# Patient Record
Sex: Female | Born: 1960 | Race: White | Hispanic: No | Marital: Married | Smoking: Never smoker
Health system: Southern US, Community
[De-identification: ages and names within clinical notes are randomized; demographics above are authoritative.]

## PROBLEM LIST (undated history)

## (undated) HISTORY — PX: BREAST BIOPSY: SHX20

---
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
## 1992-06-12 HISTORY — PX: HERNIA REPAIR: SHX51

## 1999-04-19 ENCOUNTER — Other Ambulatory Visit: Admission: RE | Admit: 1999-04-19 | Discharge: 1999-04-19 | Payer: Self-pay | Admitting: Obstetrics and Gynecology

## 1999-07-01 ENCOUNTER — Encounter: Payer: Self-pay | Admitting: Family Medicine

## 1999-07-01 ENCOUNTER — Ambulatory Visit (HOSPITAL_COMMUNITY): Admission: RE | Admit: 1999-07-01 | Discharge: 1999-07-01 | Payer: Self-pay | Admitting: Family Medicine

## 1999-08-17 ENCOUNTER — Ambulatory Visit (HOSPITAL_COMMUNITY)
Admission: RE | Admit: 1999-08-17 | Discharge: 1999-08-17 | Payer: Self-pay | Admitting: Orthodontics and Dentofacial Orthopedics

## 1999-08-17 ENCOUNTER — Encounter: Payer: Self-pay | Admitting: Orthodontics and Dentofacial Orthopedics

## 2000-08-06 ENCOUNTER — Encounter: Admission: RE | Admit: 2000-08-06 | Discharge: 2000-08-06 | Payer: Self-pay | Admitting: Sports Medicine

## 2003-08-31 ENCOUNTER — Other Ambulatory Visit: Admission: RE | Admit: 2003-08-31 | Discharge: 2003-08-31 | Payer: Self-pay | Admitting: Obstetrics and Gynecology

## 2004-12-22 ENCOUNTER — Other Ambulatory Visit: Admission: RE | Admit: 2004-12-22 | Discharge: 2004-12-22 | Payer: Self-pay | Admitting: Obstetrics and Gynecology

## 2005-01-26 ENCOUNTER — Ambulatory Visit: Payer: Self-pay | Admitting: Hematology and Oncology

## 2005-03-15 ENCOUNTER — Ambulatory Visit: Payer: Self-pay | Admitting: Hematology and Oncology

## 2006-09-20 ENCOUNTER — Encounter: Payer: Self-pay | Admitting: Sports Medicine

## 2006-09-21 ENCOUNTER — Ambulatory Visit: Payer: Self-pay | Admitting: Sports Medicine

## 2006-09-21 ENCOUNTER — Telehealth: Payer: Self-pay | Admitting: Sports Medicine

## 2006-09-21 DIAGNOSIS — M25519 Pain in unspecified shoulder: Secondary | ICD-10-CM | POA: Insufficient documentation

## 2006-09-21 DIAGNOSIS — M542 Cervicalgia: Secondary | ICD-10-CM | POA: Insufficient documentation

## 2006-10-17 DIAGNOSIS — S90129A Contusion of unspecified lesser toe(s) without damage to nail, initial encounter: Secondary | ICD-10-CM | POA: Insufficient documentation

## 2006-10-19 ENCOUNTER — Ambulatory Visit: Payer: Self-pay | Admitting: Sports Medicine

## 2006-10-19 DIAGNOSIS — Q828 Other specified congenital malformations of skin: Secondary | ICD-10-CM | POA: Insufficient documentation

## 2006-10-19 DIAGNOSIS — M6281 Muscle weakness (generalized): Secondary | ICD-10-CM | POA: Insufficient documentation

## 2008-06-22 ENCOUNTER — Ambulatory Visit: Payer: Self-pay | Admitting: Sports Medicine

## 2008-06-22 DIAGNOSIS — M7710 Lateral epicondylitis, unspecified elbow: Secondary | ICD-10-CM | POA: Insufficient documentation

## 2008-08-23 ENCOUNTER — Encounter: Payer: Self-pay | Admitting: Family Medicine

## 2008-10-05 ENCOUNTER — Encounter: Payer: Self-pay | Admitting: Family Medicine

## 2008-12-08 ENCOUNTER — Ambulatory Visit: Payer: Self-pay | Admitting: Family Medicine

## 2008-12-08 LAB — CONVERTED CEMR LAB
ALT: 12 units/L (ref 0–35)
AST: 17 units/L (ref 0–37)
Albumin: 3.9 g/dL (ref 3.5–5.2)
Alkaline Phosphatase: 34 units/L — ABNORMAL LOW (ref 39–117)
BUN: 10 mg/dL (ref 6–23)
Basophils Absolute: 0 10*3/uL (ref 0.0–0.1)
Basophils Relative: 0.1 % (ref 0.0–3.0)
Bilirubin, Direct: 0 mg/dL (ref 0.0–0.3)
CO2: 27 meq/L (ref 19–32)
Calcium: 9.3 mg/dL (ref 8.4–10.5)
Chloride: 111 meq/L (ref 96–112)
Cholesterol: 200 mg/dL (ref 0–200)
Creatinine, Ser: 0.8 mg/dL (ref 0.4–1.2)
Eosinophils Absolute: 0.4 10*3/uL (ref 0.0–0.7)
Eosinophils Relative: 7.3 % — ABNORMAL HIGH (ref 0.0–5.0)
GFR calc non Af Amer: 80.94 mL/min (ref >60)
Glucose, Bld: 86 mg/dL (ref 70–99)
HCT: 36.1 % (ref 36.0–46.0)
HDL: 60.3 mg/dL (ref >39.00)
Hemoglobin: 12.3 g/dL (ref 12.0–15.0)
LDL Cholesterol: 130 mg/dL — ABNORMAL HIGH (ref 0–99)
Lymphocytes Relative: 23.9 % (ref 12.0–46.0)
Lymphs Abs: 1.2 10*3/uL (ref 0.7–4.0)
MCHC: 34.2 g/dL (ref 30.0–36.0)
MCV: 90.5 fL (ref 78.0–100.0)
Monocytes Absolute: 0.5 10*3/uL (ref 0.1–1.0)
Monocytes Relative: 10.1 % (ref 3.0–12.0)
Neutro Abs: 2.8 10*3/uL (ref 1.4–7.7)
Neutrophils Relative %: 58.6 % (ref 43.0–77.0)
Platelets: 239 10*3/uL (ref 150.0–400.0)
Potassium: 4.2 meq/L (ref 3.5–5.1)
RBC: 3.99 M/uL (ref 3.87–5.11)
RDW: 12.4 % (ref 11.5–14.6)
Sodium: 143 meq/L (ref 135–145)
TSH: 1.62 microintl units/mL (ref 0.35–5.50)
Total Bilirubin: 0.9 mg/dL (ref 0.3–1.2)
Total CHOL/HDL Ratio: 3
Total Protein: 6.8 g/dL (ref 6.0–8.3)
Triglycerides: 51 mg/dL (ref 0.0–149.0)
VLDL: 10.2 mg/dL (ref 0.0–40.0)
WBC: 4.9 10*3/uL (ref 4.5–10.5)

## 2008-12-09 ENCOUNTER — Ambulatory Visit: Payer: Self-pay | Admitting: Family Medicine

## 2008-12-09 DIAGNOSIS — H6980 Other specified disorders of Eustachian tube, unspecified ear: Secondary | ICD-10-CM | POA: Insufficient documentation

## 2008-12-09 DIAGNOSIS — H6990 Unspecified Eustachian tube disorder, unspecified ear: Secondary | ICD-10-CM | POA: Insufficient documentation

## 2008-12-09 LAB — CONVERTED CEMR LAB
Bilirubin Urine: NEGATIVE
Blood in Urine, dipstick: NEGATIVE
Glucose, Urine, Semiquant: NEGATIVE
Ketones, urine, test strip: NEGATIVE
Nitrite: NEGATIVE
Protein, U semiquant: NEGATIVE
Specific Gravity, Urine: 1.02
Urobilinogen, UA: 0.2
WBC Urine, dipstick: NEGATIVE
pH: 5.5

## 2008-12-11 ENCOUNTER — Telehealth: Payer: Self-pay | Admitting: Family Medicine

## 2008-12-15 ENCOUNTER — Ambulatory Visit: Payer: Self-pay | Admitting: Family Medicine

## 2009-05-10 ENCOUNTER — Ambulatory Visit: Payer: Self-pay | Admitting: Family Medicine

## 2009-05-10 DIAGNOSIS — J019 Acute sinusitis, unspecified: Secondary | ICD-10-CM | POA: Insufficient documentation

## 2010-09-07 ENCOUNTER — Other Ambulatory Visit (INDEPENDENT_AMBULATORY_CARE_PROVIDER_SITE_OTHER): Payer: BC Managed Care – PPO | Admitting: Family Medicine

## 2010-09-07 DIAGNOSIS — Z0000 Encounter for general adult medical examination without abnormal findings: Secondary | ICD-10-CM

## 2010-09-07 LAB — TSH: TSH: 1.35 u[IU]/mL (ref 0.35–5.50)

## 2010-09-07 LAB — BASIC METABOLIC PANEL
BUN: 15 mg/dL (ref 6–23)
CO2: 28 mEq/L (ref 19–32)
Calcium: 9.6 mg/dL (ref 8.4–10.5)
Chloride: 108 mEq/L (ref 96–112)
Creatinine, Ser: 0.9 mg/dL (ref 0.4–1.2)
GFR: 74.94 mL/min (ref >60.00)
Glucose, Bld: 80 mg/dL (ref 70–99)
Potassium: 4.5 mEq/L (ref 3.5–5.1)
Sodium: 139 mEq/L (ref 135–145)

## 2010-09-07 LAB — HEPATIC FUNCTION PANEL
ALT: 14 U/L (ref 0–35)
AST: 15 U/L (ref 0–37)
Albumin: 4 g/dL (ref 3.5–5.2)
Alkaline Phosphatase: 39 U/L (ref 39–117)
Bilirubin, Direct: 0.1 mg/dL (ref 0.0–0.3)
Total Bilirubin: 0.5 mg/dL (ref 0.3–1.2)
Total Protein: 6.6 g/dL (ref 6.0–8.3)

## 2010-09-07 LAB — POCT URINALYSIS DIPSTICK
Bilirubin, UA: NEGATIVE
Leukocytes, UA: NEGATIVE
Nitrite, UA: NEGATIVE
Protein, UA: NEGATIVE
Urobilinogen, UA: 0.2
pH, UA: 6.5

## 2010-09-07 LAB — CBC WITH DIFFERENTIAL/PLATELET
Basophils Absolute: 0 10*3/uL (ref 0.0–0.1)
Basophils Relative: 0.4 % (ref 0.0–3.0)
Eosinophils Absolute: 0.1 10*3/uL (ref 0.0–0.7)
Eosinophils Relative: 1.9 % (ref 0.0–5.0)
HCT: 37.6 % (ref 36.0–46.0)
Hemoglobin: 12.6 g/dL (ref 12.0–15.0)
Lymphocytes Relative: 15.7 % (ref 12.0–46.0)
Lymphs Abs: 1.2 10*3/uL (ref 0.7–4.0)
MCHC: 33.7 g/dL (ref 30.0–36.0)
MCV: 93 fl (ref 78.0–100.0)
Monocytes Absolute: 0.5 10*3/uL (ref 0.1–1.0)
Monocytes Relative: 6.9 % (ref 3.0–12.0)
Neutro Abs: 5.7 10*3/uL (ref 1.4–7.7)
Neutrophils Relative %: 75.1 % (ref 43.0–77.0)
Platelets: 237 10*3/uL (ref 150.0–400.0)
RBC: 4.04 Mil/uL (ref 3.87–5.11)
RDW: 14.1 % (ref 11.5–14.6)
WBC: 7.6 10*3/uL (ref 4.5–10.5)

## 2010-09-07 LAB — LIPID PANEL
Cholesterol: 224 mg/dL — ABNORMAL HIGH (ref 0–200)
HDL: 79.1 mg/dL (ref >39.00)
Total CHOL/HDL Ratio: 3
Triglycerides: 35 mg/dL (ref 0.0–149.0)
VLDL: 7 mg/dL (ref 0.0–40.0)

## 2010-09-14 ENCOUNTER — Telehealth: Payer: Self-pay | Admitting: *Deleted

## 2010-09-14 NOTE — Telephone Encounter (Signed)
Noted.  Will assess at CPE tomorrow.

## 2010-09-14 NOTE — Telephone Encounter (Signed)
Pt fell on tennis courts several days ago and has a huge bruise.  Is a little concerned about a blood clot due to her clotting disorder, but has an appt tomorrow for CPX.  Advised to let Dr. Caryl Never see it then, and  There should be no problem with a traumatic injury causing a blood clot.  She is taking Advil.

## 2010-09-15 ENCOUNTER — Encounter: Payer: Self-pay | Admitting: Family Medicine

## 2010-09-15 ENCOUNTER — Ambulatory Visit (INDEPENDENT_AMBULATORY_CARE_PROVIDER_SITE_OTHER): Payer: BC Managed Care – PPO | Admitting: Family Medicine

## 2010-09-15 VITALS — BP 130/80 | Temp 97.0°F | Ht 66.75 in | Wt 130.0 lb

## 2010-09-15 DIAGNOSIS — Z0000 Encounter for general adult medical examination without abnormal findings: Secondary | ICD-10-CM

## 2010-09-15 NOTE — Patient Instructions (Signed)
Continue with yearly physical exam. Please contact me if you are willing to consider scheduling colonoscopy.

## 2010-09-15 NOTE — Progress Notes (Signed)
  Subjective:    Patient ID: Natalie Carpenter, female    DOB: 03-20-1961, 50 y.o.   MRN: [id number]  HPI Patient here for complete physical. She sees gynecologist for Pap smears and mammograms. She's never had colon cancer screening has not decided she wishes to pursue this time. Overall very healthy. She is vegetarian. Good knowledge of nutrition and diet.  Tetanus 2008. Exercises regularly. Calcium supplementation.  Recent fall playing tennis with large bruise right lateral hip otherwise doing well   Review of Systems  Constitutional: Negative for fever, activity change, appetite change, fatigue and unexpected weight change.  HENT: Negative for hearing loss, sore throat and trouble swallowing.   Respiratory: Negative for cough, shortness of breath and wheezing.   Cardiovascular: Negative for chest pain, palpitations and leg swelling.  Gastrointestinal: Negative for nausea, vomiting, abdominal pain, blood in stool and abdominal distention.  Genitourinary: Negative for dysuria and hematuria.  Musculoskeletal: Negative for myalgias and gait problem.  Skin: Negative for rash.  Neurological: Negative for dizziness, syncope, weakness and headaches.  Hematological: Negative for adenopathy.  Psychiatric/Behavioral: Negative for confusion and dysphoric mood.       Objective:   Physical Exam  Constitutional: She is oriented to person, place, and time. She appears well-developed and well-nourished.  HENT:  Head: Normocephalic and atraumatic.  Eyes: EOM are normal. Pupils are equal, round, and reactive to light.  Neck: Normal range of motion. Neck supple. No thyromegaly present.  Cardiovascular: Normal rate, regular rhythm and normal heart sounds.   No murmur heard. Pulmonary/Chest: Breath sounds normal. No respiratory distress. She has no wheezes. She has no rales.  Abdominal: Soft. Bowel sounds are normal. She exhibits no distension and no mass. There is no tenderness. There is no rebound and  no guarding.  Genitourinary:       Per GYN  Musculoskeletal: Normal range of motion. She exhibits no edema.       Large ecchymosis right lateral hip but excellent range of motion with internal and external rotation. Local tenderness to palpation right lateral hip  Lymphadenopathy:    She has no cervical adenopathy.  Neurological: She is alert and oriented to person, place, and time. She displays normal reflexes. No cranial nerve deficit.  Skin: No rash noted.  Psychiatric: She has a normal mood and affect. Her behavior is normal. Judgment and thought content normal.          Assessment & Plan:  Healthy 89 neural female. Tetanus up-to-date. Continue yearly mammograms. Discussed colonoscopy and have recommended and she will consider and in touch if she decides on that date for that. Labs reviewed and all excellent

## 2010-12-20 ENCOUNTER — Encounter: Payer: Self-pay | Admitting: Family Medicine

## 2011-07-20 ENCOUNTER — Telehealth: Payer: Self-pay | Admitting: Family Medicine

## 2011-07-20 NOTE — Telephone Encounter (Signed)
Pt has daughter who would be a new pt to MD, requesting 330pm ov. Can I create 30 min?

## 2011-07-21 NOTE — Telephone Encounter (Signed)
YES

## 2011-07-21 NOTE — Telephone Encounter (Signed)
lmom for pt mom to callback °

## 2011-07-24 NOTE — Telephone Encounter (Signed)
Pt daughter is sch for 08-07-2011 330pm

## 2012-01-10 ENCOUNTER — Other Ambulatory Visit: Payer: Self-pay | Admitting: Radiology

## 2012-01-12 ENCOUNTER — Encounter: Payer: Self-pay | Admitting: Family Medicine

## 2012-07-02 ENCOUNTER — Telehealth: Payer: Self-pay | Admitting: Family Medicine

## 2012-07-02 NOTE — Telephone Encounter (Signed)
Patient Information:  Caller Name: Rosena  Phone: [phone]  Patient: Kaliah, [place]da[place]  Gender: Female  DOB: August 25, 1960  Age: 52 Years  PCP: Evelena Peat (Family Practice)  Pregnant: No  Office Follow Up:  Does the office need to follow up with this patient?: No  Instructions For The Office: N/A   Symptoms  Reason For Call & Symptoms: symptoms started 3-4wks ago; took an old Zpak 1/3-1/7 which helped a little, but sxs came right back; feels achy; yellowish, thick mucus; feels congested and having pressure; unable to get rid of her HA  Reviewed Health History In EMR: Yes  Reviewed Medications In EMR: Yes  Reviewed Allergies In EMR: Yes  Reviewed Surgeries / Procedures: Yes  Date of Onset of Symptoms: Unknown  Treatments Tried: Sudafed, Tylenol, Motrin  Treatments Tried Worked: No OB / GYN:  LMP: Unknown  Guideline(s) Used:  Colds  Disposition Per Guideline:   See Today or Tomorrow in Office  Reason For Disposition Reached:   Sinus congestion (pressure, fullness) present > 10 days  Advice Given:  Call Back If:  You become worse  Appointment Scheduled:  07/03/2012 09:30:00 Appointment Scheduled Provider:  Evelena Peat (Family Practice)

## 2012-07-02 NOTE — Telephone Encounter (Deleted)
Patient Information:  Caller Name: Tinnie Gens  Phone: [phone]  Patient: Natalie Carpenter  Gender: Female  DOB: 01/15/1962  Age: 52 Years  PCP: Gershon Crane [facility])  Office Follow Up:  Does the office need to follow up with this patient?: Yes  Instructions For The Office: wants to know what OTC decongestants can be used with his HTN  RN Note:  declined appt at this time, saying he already had one 07/13/12  Symptoms  Reason For Call & Symptoms: symptoms started over a wk ago with a stuffy head, HA and greenish mucus; has a dry cough that is worse at night  Reviewed Health History In EMR: Yes  Reviewed Medications In EMR: Yes  Reviewed Allergies In EMR: Yes  Reviewed Surgeries / Procedures: Yes  Date of Onset of Symptoms: Unknown  Treatments Tried: OTC sinus pain med  Treatments Tried Worked: No  Guideline(s) Used:  Colds  Disposition Per Guideline:   See Today or Tomorrow in Office  Reason For Disposition Reached:   Sinus congestion (pressure, fullness) present > 10 days  Advice Given:  Humidifier:  If the air in your home is dry, use a cool-mist humidifier  Caution - Nasal Decongestants:  Do not take these medications if you have high blood pressure, heart disease, prostate problems, or an overactive thyroid.  Neti Pot  How it Helps: The EchoStar performs nasal washing (also called nasal irrigation or "jala neti"). The salt water rinses out excess mucus, washes out any irritants (dust, allergens) that might be present, and moisturizes the nasal cavity.  Indications: The EchoStar is widely used as a home remedy to relieve conditions such as colds, sinus infections, and hay fever (nasal allergies).  Call Back If:  You become worse  For a Stuffy Nose - Use Nasal Washes:  Introduction: Saline (salt water) nasal irrigation (nasal wash) is an effective and simple home remedy for treating stuffy nose and sinus congestion. The nose can be irrigated by pouring, spraying, or  squirting salt water into the nose and then letting it run back out.  How it Helps: The salt water rinses out excess mucus, washes out any irritants (dust, allergens) that might be present, and moistens the nasal cavity.  Methods: There are several ways to perform nasal irrigation. You can use a saline nasal spray bottle (available over-the-counter), a rubber ear syringe, a medical syringe without the needle, or a Neti Pot.  RN Overrode Recommendation:  Follow Up With Office Later  declined appt at this time

## 2012-07-03 ENCOUNTER — Encounter: Payer: Self-pay | Admitting: Family Medicine

## 2012-07-03 ENCOUNTER — Ambulatory Visit (INDEPENDENT_AMBULATORY_CARE_PROVIDER_SITE_OTHER): Payer: BC Managed Care – PPO | Admitting: Family Medicine

## 2012-07-03 VITALS — BP 118/78 | Temp 97.4°F | Wt 124.0 lb

## 2012-07-03 DIAGNOSIS — J329 Chronic sinusitis, unspecified: Secondary | ICD-10-CM

## 2012-07-03 MED ORDER — AZITHROMYCIN 250 MG PO TABS: ORAL_TABLET | ORAL | Status: DC

## 2012-07-03 NOTE — Progress Notes (Signed)
  Subjective:    Patient ID: Natalie Carpenter, female    DOB: [birth year], 52 y.o.   MRN: [id number]  HPI  For over 2 weeks, colored nasal discharge.  Aches, facial pain-maxillary PND.  Colored nasal d/c.  No fever.  Increased malaise.  No cough or sore throat.  Headaches-bifrontal .  Increased sinus pressure Frequent Tylenol and Motrin with mild relief.   Sudafed but had side effects.  Left upper teeth pain off and on. Recent dental evaluation negative   Review of Systems  Constitutional: Positive for fatigue. Negative for fever and chills.  HENT: Positive for congestion, postnasal drip and sinus pressure. Negative for sore throat.   Respiratory: Negative for cough.   Neurological: Positive for headaches.       Objective:   Physical Exam  HENT:  Right Ear: External ear normal.  Left Ear: External ear normal.  Mouth/Throat: Oropharynx is clear and moist.  Neck: Neck supple. No thyromegaly present.  Cardiovascular: Normal rate and regular rhythm.   No murmur heard. Pulmonary/Chest: Effort normal and breath sounds normal. No respiratory distress. She has no wheezes. She has no rales.  Lymphadenopathy:    She has no cervical adenopathy.          Assessment & Plan:  Acute sinusitis. Zithromax for 5 days. If no better in 2 weeks consider broad coverage for anaerobes

## 2012-07-03 NOTE — Patient Instructions (Signed)

## 2013-01-30 ENCOUNTER — Other Ambulatory Visit (INDEPENDENT_AMBULATORY_CARE_PROVIDER_SITE_OTHER): Payer: BC Managed Care – PPO

## 2013-01-30 DIAGNOSIS — Z0000 Encounter for general adult medical examination without abnormal findings: Secondary | ICD-10-CM

## 2013-01-30 LAB — POCT URINALYSIS DIPSTICK
Bilirubin, UA: NEGATIVE
Nitrite, UA: NEGATIVE
Protein, UA: NEGATIVE
pH, UA: 7

## 2013-01-30 LAB — BASIC METABOLIC PANEL
CO2: 29 mEq/L (ref 19–32)
Calcium: 10.3 mg/dL (ref 8.4–10.5)
Chloride: 102 mEq/L (ref 96–112)
Glucose, Bld: 84 mg/dL (ref 70–99)
Potassium: 4.4 mEq/L (ref 3.5–5.1)
Sodium: 136 mEq/L (ref 135–145)

## 2013-01-30 LAB — CBC WITH DIFFERENTIAL/PLATELET
HCT: 40 % (ref 36.0–46.0)
Hemoglobin: 13.3 g/dL (ref 12.0–15.0)
MCHC: 33.2 g/dL (ref 30.0–36.0)
MCV: 90.9 fl (ref 78.0–100.0)
Monocytes Absolute: 0.5 10*3/uL (ref 0.1–1.0)
Neutro Abs: 3 10*3/uL (ref 1.4–7.7)
RDW: 13.4 % (ref 11.5–14.6)

## 2013-01-30 LAB — HEPATIC FUNCTION PANEL
ALT: 13 U/L (ref 0–35)
Albumin: 4.4 g/dL (ref 3.5–5.2)
Bilirubin, Direct: 0.1 mg/dL (ref 0.0–0.3)
Total Protein: 7.5 g/dL (ref 6.0–8.3)

## 2013-01-30 LAB — LIPID PANEL
Cholesterol: 246 mg/dL — ABNORMAL HIGH (ref 0–200)
HDL: 70.5 mg/dL (ref >39.00)
Total CHOL/HDL Ratio: 3
Triglycerides: 53 mg/dL (ref 0.0–149.0)
VLDL: 10.6 mg/dL (ref 0.0–40.0)

## 2013-02-06 ENCOUNTER — Ambulatory Visit (INDEPENDENT_AMBULATORY_CARE_PROVIDER_SITE_OTHER): Payer: BC Managed Care – PPO | Admitting: Family Medicine

## 2013-02-06 ENCOUNTER — Encounter: Payer: Self-pay | Admitting: Family Medicine

## 2013-02-06 VITALS — BP 120/70 | HR 62 | Temp 97.7°F | Wt 128.0 lb

## 2013-02-06 DIAGNOSIS — Z0000 Encounter for general adult medical examination without abnormal findings: Secondary | ICD-10-CM

## 2013-02-06 NOTE — Progress Notes (Signed)
  Subjective:    Patient ID: Natalie Carpenter, female    DOB: 08-06-1960, 52 y.o.   MRN: [id number]  HPI Complete physical Patient had recent bilateral vitreous detachment and is followed by ophthalmology. Visual symptoms are stable. She is generally very healthy. She has history of mildly elevated lipids but has good HDL. Nonsmoker. Family history of premature CAD. Currently takes no regular medications. Tetanus up to date. She continues sees gynecologist regularly  She has refused colonoscopies in the past. She does agree to Hemoccult cards.  Recently done some gardening. Possible foreign body left ring finger PIP area. Nonpainful. No warmth or significant erythema.  No past medical history on file. Past Surgical History  Procedure Laterality Date  . Hernia repair  1994    umbilical hernia    reports that she has never smoked. She does not have any smokeless tobacco history on file. Her alcohol and drug histories are not on file. family history includes Cancer in her father; Sarcoidosis in her sister. Allergies  Allergen Reactions  . Sulfonamide Derivatives     REACTION: rash      Review of Systems  Constitutional: Negative for fever, activity change, appetite change, fatigue and unexpected weight change.  HENT: Negative for hearing loss, ear pain, sore throat and trouble swallowing.   Eyes: Negative for photophobia, pain and redness.  Respiratory: Negative for cough and shortness of breath.   Cardiovascular: Negative for chest pain and palpitations.  Gastrointestinal: Negative for abdominal pain, diarrhea, constipation and blood in stool.  Genitourinary: Negative for dysuria and hematuria.  Musculoskeletal: Negative for myalgias, back pain and arthralgias.  Skin: Negative for rash.  Neurological: Negative for dizziness, syncope and headaches.  Hematological: Negative for adenopathy.  Psychiatric/Behavioral: Negative for confusion and dysphoric mood.       Objective:   Physical Exam  Constitutional: She is oriented to person, place, and time. She appears well-developed and well-nourished.  HENT:  Head: Normocephalic and atraumatic.  Eyes: EOM are normal. Pupils are equal, round, and reactive to light.  Neck: Normal range of motion. Neck supple. No thyromegaly present.  Cardiovascular: Normal rate, regular rhythm and normal heart sounds.   No murmur heard. Pulmonary/Chest: Breath sounds normal. No respiratory distress. She has no wheezes. She has no rales.  Abdominal: Soft. Bowel sounds are normal. She exhibits no distension and no mass. There is no tenderness. There is no rebound and no guarding.  Genitourinary:  Per GYN  Musculoskeletal: Normal range of motion. She exhibits no edema.  Left ring finger reveals minimal soft tissue edema over the PIP joint but no significant erythema and no warmth. No tenderness. Full range of motion.  Lymphadenopathy:    She has no cervical adenopathy.  Neurological: She is alert and oriented to person, place, and time. She displays normal reflexes. No cranial nerve deficit.  Skin: No rash noted.  Psychiatric: She has a normal mood and affect. Her behavior is normal. Judgment and thought content normal.          Assessment & Plan:  Complete physical. Labs reviewed. She has elevated cholesterol 246 but excellent HDL. Overall very low risk for CAD with computed 10 year risk of 1%. No indication for statin therapy. Cholesterol control diet discussed. Discussed colonoscopy screening and at this point she is not willing to set up. She does agree to Hemoccult cards.

## 2013-02-06 NOTE — Patient Instructions (Addendum)

## 2013-07-09 ENCOUNTER — Telehealth: Payer: Self-pay | Admitting: Family Medicine

## 2013-07-09 ENCOUNTER — Ambulatory Visit: Payer: BC Managed Care – PPO | Admitting: Family Medicine

## 2013-07-09 NOTE — Telephone Encounter (Signed)
Patient Information:  Caller Name: Simya  Phone: [phone]  Patient: Natalie Carpenter, Natalie Carpenter  Gender: Female  DOB: [birth year]  Age: 53 Years  PCP: Carolann Littler [facility])  Pregnant: No  Office Follow Up:  Does the office need to follow up with this patient?: No  Instructions For The Office: N/A  RN Note:  LMP 2/14. Sore throat is worst symptom; pain rated 7/10. Drinking & eating soft foods. Requested appointment for 07/09/13 at 1430 be cancelled.  Will follow home care recommendations and call if symptoms persist or worsen.  Symptoms  Reason For Call & Symptoms: Gradual onset of chest and head congestion, sore throat, headache and body aching.  No fever.  Reviewed Health History In EMR: Yes  Reviewed Medications In EMR: Yes  Reviewed Allergies In EMR: Yes  Reviewed Surgeries / Procedures: Yes  Date of Onset of Symptoms: 07/06/2013  Treatments Tried: Motrin, Tylenol, Nyquil, fluids, honey, Ricola  Treatments Tried Worked: No OB / GYN:  LMP: Unknown  Guideline(s) Used:  Colds  Sore Throat  Disposition Per Guideline:   Home Care  Reason For Disposition Reached:   Sore throat  Advice Given:  For Relief of Sore Throat Pain:  Sip warm chicken broth or apple juice.  Suck on hard candy or a throat lozenge (over-the-counter).  Gargle warm salt water 3 times daily (1 teaspoon of salt in 8 oz or 240 ml of warm water).  Pain Medicines:  For pain relief, you can take either acetaminophen, ibuprofen, or naproxen.  They are over-the-counter (OTC) pain drugs. You can buy them at the drugstore.  Ibuprofen (e.g., Motrin, Advil):  Take 400 mg (two 200 mg pills) by mouth every 6 hours.  Another choice is to take 600 mg (three 200 mg pills) by mouth every 8 hours.  The most you should take each day is 1,200 mg (six 200 mg pills), unless your doctor has told you to take more.  Fever Medicines:  For fevers above 101 F (38.3 C) take either acetaminophen or ibuprofen.  They are  over-the-counter (OTC) drugs that help treat both fever and pain. You can buy them at the drugstore.  Soft Diet:   Cold drinks and milk shakes are especially good (Reason: swollen tonsils can make some foods hard to swallow).  Liquids:  Adequate liquid intake is important to prevent dehydration. Drink 6-8 glasses of water per day.  Contagiousness:   You can return to work or school after the fever is gone and you feel well enough to participate in normal activities. If your doctor determines that you have Strep throat, then you will need to take an antibiotic for 24 hours before you can return.  Expected Course:  Sore throats with viral illnesses usually last 3 or 4 days.  Call Back If:  Sore throat is the main symptom and it lasts longer than 24 hours  Sore throat is mild but lasts longer than 4 days  You become worse.  Patient Will Follow Care Advice:  YES

## 2013-07-14 ENCOUNTER — Encounter: Payer: Self-pay | Admitting: Family Medicine

## 2013-07-14 ENCOUNTER — Ambulatory Visit (INDEPENDENT_AMBULATORY_CARE_PROVIDER_SITE_OTHER): Payer: BC Managed Care – PPO | Admitting: Family Medicine

## 2013-07-14 VITALS — BP 102/70 | HR 62 | Temp 98.5°F | Wt 131.0 lb

## 2013-07-14 DIAGNOSIS — J209 Acute bronchitis, unspecified: Secondary | ICD-10-CM

## 2013-07-14 MED ORDER — BENZONATATE 200 MG PO CAPS: 200.0000 mg | ORAL_CAPSULE | Freq: Three times a day (TID) | ORAL | Status: DC | PRN

## 2013-07-14 NOTE — Progress Notes (Signed)
   Subjective:    Patient ID: Natalie Carpenter, female    DOB: [birth year], 53 y.o.   MRN: [id number]  HPI Acute visit Onset about 8 days cough, nasal congestion and increased malaise. Cough has been mostly nonproductive during this time. She is nonsmoker. No history of asthma. She's been taking over-the-counter medications without much relief. Denies any facial pain. No significant sore throat.  No past medical history on file. Past Surgical History  Procedure Laterality Date  . Hernia repair  1655    umbilical hernia    reports that she has never smoked. She does not have any smokeless tobacco history on file. Her alcohol and drug histories are not on file. family history includes Cancer in her father; Sarcoidosis in her sister. Allergies  Allergen Reactions  . Sulfonamide Derivatives     REACTION: rash      Review of Systems  Constitutional: Positive for fatigue.  HENT: Positive for congestion.   Respiratory: Positive for cough.   Gastrointestinal: Negative for nausea and vomiting.  Neurological: Negative for headaches.       Objective:   Physical Exam  Constitutional: She appears well-developed and well-nourished.  HENT:  Right Ear: External ear normal.  Left Ear: External ear normal.  Mouth/Throat: Oropharynx is clear and moist.  Neck: Neck supple.  Cardiovascular: Normal rate.   Pulmonary/Chest: Effort normal and breath sounds normal. No respiratory distress. She has no wheezes. She has no rales.  Lymphadenopathy:    She has no cervical adenopathy.          Assessment & Plan:  Acute bronchitis. Suspect viral. We recommended symptomatic treatment. Tessalon Perles 200 mg every 8 hours for cough. Followup promptly for fever or worsening symptoms.

## 2013-07-14 NOTE — Patient Instructions (Signed)
Acute Bronchitis Bronchitis is inflammation of the airways that extend from the windpipe into the lungs (bronchi). The inflammation often causes mucus to develop. This leads to a cough, which is the most common symptom of bronchitis.  In acute bronchitis, the condition usually develops suddenly and goes away over time, usually in a couple weeks. Smoking, allergies, and asthma can make bronchitis worse. Repeated episodes of bronchitis may cause further lung problems.  CAUSES Acute bronchitis is most often caused by the same virus that causes a cold. The virus can spread from person to person (contagious).  SIGNS AND SYMPTOMS   Cough.   Fever.   Coughing up mucus.   Body aches.   Chest congestion.   Chills.   Shortness of breath.   Sore throat.  DIAGNOSIS  Acute bronchitis is usually diagnosed through a physical exam. Tests, such as chest X-rays, are sometimes done to rule out other conditions.  TREATMENT  Acute bronchitis usually goes away in a couple weeks. Often times, no medical treatment is necessary. Medicines are sometimes given for relief of fever or cough. Antibiotics are usually not needed but may be prescribed in certain situations. In some cases, an inhaler may be recommended to help reduce shortness of breath and control the cough. A cool mist vaporizer may also be used to help thin bronchial secretions and make it easier to clear the chest.  HOME CARE INSTRUCTIONS  Get plenty of rest.   Drink enough fluids to keep your urine clear or pale yellow (unless you have a medical condition that requires fluid restriction). Increasing fluids may help thin your secretions and will prevent dehydration.   Only take over-the-counter or prescription medicines as directed by your health care provider.   Avoid smoking and secondhand smoke. Exposure to cigarette smoke or irritating chemicals will make bronchitis worse. If you are a smoker, consider using nicotine gum or skin  patches to help control withdrawal symptoms. Quitting smoking will help your lungs heal faster.   Reduce the chances of another bout of acute bronchitis by washing your hands frequently, avoiding people with cold symptoms, and trying not to touch your hands to your mouth, nose, or eyes.   Follow up with your health care provider as directed.  SEEK MEDICAL CARE IF: Your symptoms do not improve after 1 week of treatment.  SEEK IMMEDIATE MEDICAL CARE IF:  You develop an increased fever or chills.   You have chest pain.   You have severe shortness of breath.  You have bloody sputum.   You develop dehydration.  You develop fainting.  You develop repeated vomiting.  You develop a severe headache. MAKE SURE YOU:   Understand these instructions.  Will watch your condition.  Will get help right away if you are not doing well or get worse. Document Released: 07/06/2004 Document Revised: 01/29/2013 Document Reviewed: 11/19/2012 [employer] Patient Information 2014 [employer].  

## 2013-07-14 NOTE — Progress Notes (Signed)
Pre visit review using our clinic review tool, if applicable. No additional management support is needed unless otherwise documented below in the visit note. 

## 2013-07-23 ENCOUNTER — Telehealth: Payer: Self-pay | Admitting: Family Medicine

## 2013-07-23 ENCOUNTER — Encounter: Payer: Self-pay | Admitting: Family Medicine

## 2013-07-23 ENCOUNTER — Ambulatory Visit (INDEPENDENT_AMBULATORY_CARE_PROVIDER_SITE_OTHER): Payer: BC Managed Care – PPO | Admitting: Family Medicine

## 2013-07-23 VITALS — BP 110/70 | HR 81 | Temp 97.9°F | Wt 132.0 lb

## 2013-07-23 DIAGNOSIS — R059 Cough, unspecified: Secondary | ICD-10-CM

## 2013-07-23 DIAGNOSIS — R5383 Other fatigue: Principal | ICD-10-CM

## 2013-07-23 DIAGNOSIS — R5381 Other malaise: Secondary | ICD-10-CM

## 2013-07-23 DIAGNOSIS — R05 Cough: Secondary | ICD-10-CM

## 2013-07-23 MED ORDER — AZITHROMYCIN 250 MG PO TABS: ORAL_TABLET | ORAL | Status: AC

## 2013-07-23 NOTE — Patient Instructions (Signed)
Follow up promptly for any fever or increased shortness of breath. 

## 2013-07-23 NOTE — Telephone Encounter (Signed)
Patient Information:  Caller Name: Makailyn  Phone: [phone]  Patient: Natalie Carpenter, Natalie Carpenter  Gender: Female  DOB: 1960/07/19  Age: 53 Years  PCP: Carolann Littler (Family Practice)  Pregnant: No  Office Follow Up:  Does the office need to follow up with this patient?: No  Instructions For The Office: N/A  RN Note:  Post menopausal. Patient states she developed cough, congestion, sore throat, headache, bodys aches, onset 07/06/13. Patient states sore throat and body aches improved but she continues to have cough and chest congestion. Patient states she is expectorating white sputum. Patient states she has had increased fatigue, onset X 2 weeks. Patient is taking fluids well. Urinating normally for patient. Patient states she is having extreme fatigue. Care advice given per guidelines. Call back parameters reviewed. Patient verbalizes understanding.  Symptoms  Reason For Call & Symptoms: Cough, chest congestion, fatigue  Reviewed Health History In EMR: Yes  Reviewed Medications In EMR: Yes  Reviewed Allergies In EMR: Yes  Reviewed Surgeries / Procedures: Yes  Date of Onset of Symptoms: 07/06/2013  Treatments Tried: Nyquil  Treatments Tried Worked: No OB / GYN:  LMP: Unknown  Guideline(s) Used:  Cough  Disposition Per Guideline:   See Within 3 Days in Office  Reason For Disposition Reached:   Cough has been present for > 10 days  Advice Given:  Coughing Spasms:  Drink warm fluids. Inhale warm mist (Reason: both relax the airway and loosen up the phlegm).  Prevent Dehydration:  Drink adequate liquids.  Avoid Tobacco Smoke:  Smoking or being exposed to smoke makes coughs much worse.  Call Back If:  Difficulty breathing  You become worse.  Patient Will Follow Care Advice:  YES  Appointment Scheduled:  07/23/2013 16:30:00 Appointment Scheduled Provider:  Carolann Littler [facility])

## 2013-07-23 NOTE — Progress Notes (Signed)
   Subjective:    Patient ID: Natalie Carpenter, female    DOB: Aug 10, 1960, 53 y.o.   MRN: [id number]  Cough Pertinent negatives include no chills, fever, sore throat or wheezing.   Patient seen for persistent productive cough  Nonsmoker and no chronic lung problems. Onset of cough is almost 3 weeks ago. More than anything, she is concerned because she is feeling very fatigued. She is still exercising some but has much less stamina. She initially has some sore throat but none now. Minimal if any sinus congestion. No facial pain. Cough is productive of thick yellow sputum. No hemoptysis. No wheezing. Denies any dysuria. No abdominal pain. Rare headaches.  No past medical history on file. Past Surgical History  Procedure Laterality Date  . Hernia repair  9528    umbilical hernia    reports that she has never smoked. She does not have any smokeless tobacco history on file. Her alcohol and drug histories are not on file. family history includes Cancer in her father; Sarcoidosis in her sister. Allergies  Allergen Reactions  . Sulfonamide Derivatives     REACTION: rash      Review of Systems  Constitutional: Positive for fatigue. Negative for fever and chills.  HENT: Negative for congestion and sore throat.   Respiratory: Positive for cough. Negative for wheezing.        Objective:   Physical Exam  Constitutional: She appears well-developed and well-nourished.  HENT:  Right Ear: External ear normal.  Left Ear: External ear normal.  Mouth/Throat: Oropharynx is clear and moist.  Neck: Neck supple.  Cardiovascular: Normal rate.   Pulmonary/Chest: Effort normal and breath sounds normal. No respiratory distress. She has no wheezes. She has no rales.  Lymphadenopathy:    She has no cervical adenopathy.          Assessment & Plan:  Cough and fatigue. Suspect this is still viral but given duration productive cough start Zithromax. Patient requesting CBC and will check. She does not  have evidence clinically for pneumonia and no evidence for reactive airway disease. She's not describing any dyspnea but consider chest x-ray if not improving over the next week or so

## 2013-07-23 NOTE — Progress Notes (Signed)
Pre visit review using our clinic review tool, if applicable. No additional management support is needed unless otherwise documented below in the visit note. 

## 2013-07-24 ENCOUNTER — Other Ambulatory Visit: Payer: Self-pay | Admitting: Family Medicine

## 2013-07-24 DIAGNOSIS — D649 Anemia, unspecified: Secondary | ICD-10-CM

## 2013-07-24 LAB — CBC WITH DIFFERENTIAL/PLATELET
BASOS ABS: 0 10*3/uL (ref 0.0–0.1)
BASOS PCT: 0.6 % (ref 0.0–3.0)
Eosinophils Absolute: 0.1 10*3/uL (ref 0.0–0.7)
Eosinophils Relative: 1.6 % (ref 0.0–5.0)
HEMATOCRIT: 35 % — AB (ref 36.0–46.0)
HEMOGLOBIN: 11.3 g/dL — AB (ref 12.0–15.0)
LYMPHS ABS: 2 10*3/uL (ref 0.7–4.0)
LYMPHS PCT: 28.2 % (ref 12.0–46.0)
MCHC: 32.4 g/dL (ref 30.0–36.0)
MCV: 91.8 fl (ref 78.0–100.0)
MONOS PCT: 7.5 % (ref 3.0–12.0)
Monocytes Absolute: 0.5 10*3/uL (ref 0.1–1.0)
NEUTROS ABS: 4.4 10*3/uL (ref 1.4–7.7)
Neutrophils Relative %: 62.1 % (ref 43.0–77.0)
Platelets: 314 10*3/uL (ref 150.0–400.0)
RBC: 3.82 Mil/uL — AB (ref 3.87–5.11)
RDW: 13.4 % (ref 11.5–14.6)
WBC: 7 10*3/uL (ref 4.5–10.5)

## 2013-10-15 ENCOUNTER — Other Ambulatory Visit (INDEPENDENT_AMBULATORY_CARE_PROVIDER_SITE_OTHER): Payer: BC Managed Care – PPO

## 2013-10-15 DIAGNOSIS — D649 Anemia, unspecified: Secondary | ICD-10-CM

## 2013-10-15 LAB — CBC WITH DIFFERENTIAL/PLATELET
BASOS PCT: 0 % (ref 0.0–3.0)
Basophils Absolute: 0 10*3/uL (ref 0.0–0.1)
EOS ABS: 0 10*3/uL (ref 0.0–0.7)
EOS PCT: 0.2 % (ref 0.0–5.0)
HCT: 34 % — ABNORMAL LOW (ref 36.0–46.0)
Hemoglobin: 11.3 g/dL — ABNORMAL LOW (ref 12.0–15.0)
Lymphocytes Relative: 17.7 % (ref 12.0–46.0)
Lymphs Abs: 1.7 10*3/uL (ref 0.7–4.0)
MCHC: 33.3 g/dL (ref 30.0–36.0)
MCV: 90.3 fl (ref 78.0–100.0)
MONOS PCT: 5.8 % (ref 3.0–12.0)
Monocytes Absolute: 0.6 10*3/uL (ref 0.1–1.0)
NEUTROS PCT: 76.3 % (ref 43.0–77.0)
Neutro Abs: 7.4 10*3/uL (ref 1.4–7.7)
Platelets: 271 10*3/uL (ref 150.0–400.0)
RBC: 3.76 Mil/uL — AB (ref 3.87–5.11)
RDW: 13.3 % (ref 11.5–15.5)
WBC: 9.8 10*3/uL (ref 4.0–10.5)

## 2013-10-16 ENCOUNTER — Other Ambulatory Visit: Payer: Self-pay | Admitting: Family Medicine

## 2013-10-16 ENCOUNTER — Telehealth: Payer: Self-pay

## 2013-10-16 DIAGNOSIS — D649 Anemia, unspecified: Secondary | ICD-10-CM

## 2013-10-16 NOTE — Telephone Encounter (Signed)
When do you want patient to come back in to get labs done?

## 2013-10-16 NOTE — Telephone Encounter (Signed)
Anytime in the next 1-2 weeks at her convenience.

## 2013-10-16 NOTE — Telephone Encounter (Signed)
Pt informed

## 2013-10-27 ENCOUNTER — Other Ambulatory Visit (INDEPENDENT_AMBULATORY_CARE_PROVIDER_SITE_OTHER): Payer: BC Managed Care – PPO

## 2013-10-27 DIAGNOSIS — R195 Other fecal abnormalities: Secondary | ICD-10-CM

## 2013-10-27 LAB — POC HEMOCCULT BLD/STL (HOME/3-CARD/SCREEN)
CARD #2 DATE: NEGATIVE
Card #1 Date: NEGATIVE
Card #2 Fecal Occult Blod, POC: NEGATIVE
Card #3 Date: NEGATIVE
FECAL OCCULT BLD: NEGATIVE
FECAL OCCULT BLD: NEGATIVE

## 2013-10-30 ENCOUNTER — Other Ambulatory Visit (INDEPENDENT_AMBULATORY_CARE_PROVIDER_SITE_OTHER): Payer: BC Managed Care – PPO

## 2013-10-30 DIAGNOSIS — D649 Anemia, unspecified: Secondary | ICD-10-CM

## 2013-10-30 LAB — IRON: Iron: 78 ug/dL (ref 42–145)

## 2013-10-30 LAB — VITAMIN B12: Vitamin B-12: 602 pg/mL (ref 211–911)

## 2013-10-30 LAB — FERRITIN: Ferritin: 20.4 ng/mL (ref 10.0–291.0)

## 2013-11-05 ENCOUNTER — Other Ambulatory Visit (INDEPENDENT_AMBULATORY_CARE_PROVIDER_SITE_OTHER): Payer: BC Managed Care – PPO

## 2013-11-05 ENCOUNTER — Other Ambulatory Visit: Payer: Self-pay

## 2013-11-05 ENCOUNTER — Telehealth: Payer: Self-pay | Admitting: Family Medicine

## 2013-11-05 ENCOUNTER — Encounter: Payer: Self-pay | Admitting: Family Medicine

## 2013-11-05 DIAGNOSIS — R5383 Other fatigue: Secondary | ICD-10-CM

## 2013-11-05 DIAGNOSIS — R5381 Other malaise: Secondary | ICD-10-CM

## 2013-11-05 LAB — TSH: TSH: 1.61 u[IU]/mL (ref 0.35–4.50)

## 2013-11-05 NOTE — Telephone Encounter (Signed)
yes

## 2013-11-05 NOTE — Telephone Encounter (Signed)
Last visit 07/23/13

## 2013-11-05 NOTE — Telephone Encounter (Signed)
Pt will come in today

## 2013-11-05 NOTE — Telephone Encounter (Signed)
Pt is still having fatigue and would like to come in to have tsh check. Can I sch?

## 2013-11-05 NOTE — Telephone Encounter (Signed)
You can schedule. Lab is ordered.

## 2014-03-09 ENCOUNTER — Telehealth: Payer: Self-pay | Admitting: Family Medicine

## 2014-03-09 MED ORDER — AZITHROMYCIN 250 MG PO TABS: ORAL_TABLET | ORAL | Status: DC

## 2014-03-09 NOTE — Telephone Encounter (Signed)
Pt states she is going to [place] in a couple of days on a mission trip and has a upper resp./sinus/cold  Issue starting and would like to have a abx to take w/ her.  Pt refused an appt.  Pt states you can call her if you need to. [facility].

## 2014-03-09 NOTE — Telephone Encounter (Signed)
Let's send in rx for Z-pack to take if she has any acute sinusitis symptoms.  We have seen pt for these in past and she knows what to watch for.

## 2014-03-09 NOTE — Telephone Encounter (Signed)
Rx sent to pharmacy and pt is aware. 

## 2014-03-26 ENCOUNTER — Other Ambulatory Visit: Payer: Self-pay

## 2014-03-26 ENCOUNTER — Ambulatory Visit (INDEPENDENT_AMBULATORY_CARE_PROVIDER_SITE_OTHER): Payer: BC Managed Care – PPO

## 2014-03-26 ENCOUNTER — Other Ambulatory Visit: Payer: Self-pay | Admitting: Obstetrics

## 2014-03-26 DIAGNOSIS — D242 Benign neoplasm of left breast: Secondary | ICD-10-CM

## 2014-03-26 DIAGNOSIS — Z23 Encounter for immunization: Secondary | ICD-10-CM

## 2014-04-06 ENCOUNTER — Ambulatory Visit
Admission: RE | Admit: 2014-04-06 | Discharge: 2014-04-06 | Disposition: A | Discharge status: Home / Self Care | Payer: BC Managed Care – PPO | Source: Ambulatory Visit | Attending: Obstetrics | Admitting: Obstetrics

## 2014-04-06 ENCOUNTER — Other Ambulatory Visit: Payer: Self-pay | Admitting: Obstetrics

## 2014-04-06 DIAGNOSIS — D242 Benign neoplasm of left breast: Secondary | ICD-10-CM

## 2015-03-09 ENCOUNTER — Other Ambulatory Visit: Payer: Self-pay

## 2015-03-09 DIAGNOSIS — Z1231 Encounter for screening mammogram for malignant neoplasm of breast: Secondary | ICD-10-CM

## 2015-04-08 ENCOUNTER — Ambulatory Visit
Admission: RE | Admit: 2015-04-08 | Discharge: 2015-04-08 | Disposition: A | Discharge status: Home / Self Care | Payer: BC Managed Care – PPO | Source: Ambulatory Visit

## 2015-04-08 DIAGNOSIS — Z1231 Encounter for screening mammogram for malignant neoplasm of breast: Secondary | ICD-10-CM

## 2015-04-12 ENCOUNTER — Telehealth: Payer: Self-pay | Admitting: Family Medicine

## 2015-04-12 NOTE — Telephone Encounter (Signed)
Noted  

## 2015-04-12 NOTE — Telephone Encounter (Signed)
Patient Name: Natalie Carpenter  DOB: 1960/07/10    Initial Comment Caller states she has been coughing every time she eats x 1 month, chest gets tight. Chest is tight right now.   Nurse Assessment  Nurse: Thad Ranger RN, Denise Date/Time (Eastern Time): 04/12/2015 1:18:24 PM  Confirm and document reason for call. If symptomatic, describe symptoms. ---Caller states she has been coughing every time she eats x 1 month, chest gets tight. States she has chest tightness and a prod cough. States she does not feel like she can get a deep breath, but is not having diff breathing.  Has the patient traveled out of the country within the last 30 days? ---Not Applicable  Does the patient have any new or worsening symptoms? ---Yes  Will a triage be completed? ---Yes  Related visit to physician within the last 2 weeks? ---No  Does the PT have any chronic conditions? (i.e. diabetes, asthma, etc.) ---No     Guidelines    Guideline Title Affirmed Question Affirmed Notes  Cough - Acute Productive Cough present > 10 days    Final Disposition User   See PCP When Office is Open (within 3 days) [place], RN, Langley Gauss    Comments  Hx of Prothrombin 2 clotting disorder  Pt reports she has an appt with Dr Alysia Penna on Wed, 04/14/15 at 1530. Advised to keep the appt as scheduled.   Referrals  REFERRED TO PCP OFFICE   Disagree/Comply: Comply

## 2015-04-14 ENCOUNTER — Encounter: Payer: Self-pay | Admitting: Family Medicine

## 2015-04-14 ENCOUNTER — Ambulatory Visit (INDEPENDENT_AMBULATORY_CARE_PROVIDER_SITE_OTHER): Payer: BC Managed Care – PPO | Admitting: Family Medicine

## 2015-04-14 VITALS — BP 109/76 | HR 70 | Temp 98.7°F | Ht 66.75 in | Wt 128.0 lb

## 2015-04-14 DIAGNOSIS — R059 Cough, unspecified: Secondary | ICD-10-CM

## 2015-04-14 DIAGNOSIS — R0789 Other chest pain: Secondary | ICD-10-CM

## 2015-04-14 DIAGNOSIS — R05 Cough: Secondary | ICD-10-CM

## 2015-04-14 NOTE — Progress Notes (Signed)
Pre visit review using our clinic review tool, if applicable. No additional management support is needed unless otherwise documented below in the visit note. 

## 2015-04-15 ENCOUNTER — Encounter: Payer: Self-pay | Admitting: Family Medicine

## 2015-04-15 LAB — CBC WITH DIFFERENTIAL/PLATELET
Basophils Absolute: 0.1 10*3/uL (ref 0.0–0.1)
Basophils Relative: 0.7 % (ref 0.0–3.0)
Eosinophils Absolute: 0.1 10*3/uL (ref 0.0–0.7)
Eosinophils Relative: 1.6 % (ref 0.0–5.0)
HCT: 37.8 % (ref 36.0–46.0)
Hemoglobin: 12.3 g/dL (ref 12.0–15.0)
LYMPHS ABS: 2.3 10*3/uL (ref 0.7–4.0)
Lymphocytes Relative: 30.3 % (ref 12.0–46.0)
MCHC: 32.5 g/dL (ref 30.0–36.0)
MCV: 89.6 fl (ref 78.0–100.0)
MONO ABS: 0.7 10*3/uL (ref 0.1–1.0)
MONOS PCT: 8.7 % (ref 3.0–12.0)
NEUTROS ABS: 4.5 10*3/uL (ref 1.4–7.7)
NEUTROS PCT: 58.7 % (ref 43.0–77.0)
PLATELETS: 307 10*3/uL (ref 150.0–400.0)
RBC: 4.23 Mil/uL (ref 3.87–5.11)
RDW: 13.9 % (ref 11.5–15.5)
WBC: 7.6 10*3/uL (ref 4.0–10.5)

## 2015-04-15 LAB — BASIC METABOLIC PANEL
BUN: 9 mg/dL (ref 6–23)
CALCIUM: 10 mg/dL (ref 8.4–10.5)
CO2: 28 meq/L (ref 19–32)
Chloride: 105 mEq/L (ref 96–112)
Creatinine, Ser: 0.74 mg/dL (ref 0.40–1.20)
GFR: 86.41 mL/min (ref >60.00)
GLUCOSE: 72 mg/dL (ref 70–99)
Potassium: 6.4 mEq/L (ref 3.5–5.1)
SODIUM: 140 meq/L (ref 135–145)

## 2015-04-15 NOTE — Progress Notes (Signed)
   Subjective:    Patient ID: Natalie Carpenter, female    DOB: 14-Sep-1960, 54 y.o.   MRN: [id number]  HPI Here for 6 weeks of intermittent coughing when she eats. The cough is non-productive. The cough begins within a few seconds of starting to eat a meal and it lasts until she stops eating. No chest pain or SOB, but se does describe a chest pressure which comes and goes. No heartburn or trouble swallowing . She already tried taking Prevacid OTC with no improvement. She is also worried about the possibility of a pulmonary embolism. She denies any pain or swelling in the legs. She has been diagnosed with a Prothrombin II disorder that could make her a higher risk than normal for blood clots.    Review of Systems  Constitutional: Negative.   HENT: Negative.   Eyes: Negative.   Respiratory: Positive for cough and chest tightness. Negative for choking, shortness of breath and wheezing.   Cardiovascular: Negative.   Gastrointestinal: Negative.   Neurological: Negative.        Objective:   Physical Exam  Constitutional: She is oriented to person, place, and time. She appears well-developed and well-nourished. No distress.  Neck: Neck supple. No tracheal deviation present. No thyromegaly present.  Cardiovascular: Normal rate, regular rhythm, normal heart sounds and intact distal pulses.   Pulmonary/Chest: Effort normal and breath sounds normal. No stridor. No respiratory distress. She has no wheezes. She has no rales.  Abdominal: Soft. Bowel sounds are normal. She exhibits no distension and no mass. There is no tenderness. There is no rebound and no guarding.  Lymphadenopathy:    She has no cervical adenopathy.  Neurological: She is alert and oriented to person, place, and time.          Assessment & Plan:  Coughing when eating. Possible etiologies include esophageal dysmotility, GERD, or aspiration. We will set up a swallowing study and a chest CT angiogram soon.

## 2015-04-15 NOTE — Addendum Note (Signed)
Addended by: Alysia Penna A on: 04/15/2015 09:29 PM   Modules accepted: Orders

## 2015-04-16 ENCOUNTER — Inpatient Hospital Stay: Admission: RE | Admit: 2015-04-16 | Payer: BC Managed Care – PPO | Source: Ambulatory Visit

## 2015-04-20 ENCOUNTER — Telehealth: Payer: Self-pay | Admitting: Family Medicine

## 2015-04-20 DIAGNOSIS — R05 Cough: Secondary | ICD-10-CM

## 2015-04-20 DIAGNOSIS — R053 Chronic cough: Secondary | ICD-10-CM

## 2015-04-20 NOTE — Telephone Encounter (Signed)
Please advise about appointment tomorrow

## 2015-04-20 NOTE — Telephone Encounter (Signed)
Pt call to say that she saw Dr Sarajane Jews last week for chest congestion and cough . Pt is scheduled for a CT scan on 04/21/15 and has some questions and is requesting a call back.

## 2015-04-20 NOTE — Telephone Encounter (Signed)
Patient Name: Natalie Carpenter DOB: [birth year] Initial Comment Caller states was in on Wedneaday; chest congestion; cough , burping a lot, Nurse Assessment Nurse: Vallery Sa, RN, Cathy Date/Time (Eastern Time): 04/20/2015 2:02:38 PM Confirm and document reason for call. If symptomatic, describe symptoms. ---Caller states she is going into GYN MD office now and can't talk. She asks to get a message to MD re: can she cancel testing scheduled for tomorrow and just get a Chest Xray. She will call back after her current GYN appointment. Has the patient traveled out of the country within the last 30 days? ---Not Applicable Does the patient have any new or worsening symptoms? ---No Guidelines Guideline Title Affirmed Question Affirmed Notes Final Disposition User Clinical [place], RN, Cathy Referrals GO TO FACILITY OTHER - SPECIFY

## 2015-04-20 NOTE — Telephone Encounter (Signed)
Pt called back and requested to speak to someone about this scan tomorrow. Transferred to triage.

## 2015-04-20 NOTE — Telephone Encounter (Signed)
Natalie Carpenter at [facility] called  to advise Dr Sarajane Jews pt has cancelled her CT scan for tomorrow and just wants a chest XRAY.  However, they do  not have an order for a chest xray.  There is a message from the triage from today . Pt is a [facility] pt but saw dr fry for the acute issue last week.

## 2015-04-20 NOTE — Telephone Encounter (Signed)
OK to order CXR- dx persistent cough

## 2015-04-21 ENCOUNTER — Inpatient Hospital Stay: Admission: RE | Admit: 2015-04-21 | Payer: BC Managed Care – PPO | Source: Ambulatory Visit

## 2015-04-21 ENCOUNTER — Other Ambulatory Visit: Payer: Self-pay | Admitting: Family Medicine

## 2015-04-21 ENCOUNTER — Ambulatory Visit (INDEPENDENT_AMBULATORY_CARE_PROVIDER_SITE_OTHER)
Admission: RE | Admit: 2015-04-21 | Discharge: 2015-04-21 | Disposition: A | Discharge status: Home / Self Care | Payer: BC Managed Care – PPO | Source: Ambulatory Visit | Attending: Family Medicine | Admitting: Family Medicine

## 2015-04-21 DIAGNOSIS — R05 Cough: Secondary | ICD-10-CM

## 2015-04-21 DIAGNOSIS — R053 Chronic cough: Secondary | ICD-10-CM

## 2015-04-21 NOTE — Telephone Encounter (Signed)
Spoke to pt, told her Chest x-ray was ordered, can go to [facility] on [address] across from [facility] in the basement. Pt verbalized understanding. Told pt when we get results will contact her. Pt verbalized understanding.

## 2015-04-21 NOTE — Telephone Encounter (Signed)
I spoke with pt  

## 2015-04-21 NOTE — Telephone Encounter (Signed)
Tell her Dr. Elease Hashimoto already order a CXR. She can follow up with him

## 2015-04-28 ENCOUNTER — Other Ambulatory Visit (INDEPENDENT_AMBULATORY_CARE_PROVIDER_SITE_OTHER): Payer: BC Managed Care – PPO

## 2015-04-28 DIAGNOSIS — R05 Cough: Secondary | ICD-10-CM

## 2015-04-28 DIAGNOSIS — R059 Cough, unspecified: Secondary | ICD-10-CM

## 2015-04-28 LAB — BASIC METABOLIC PANEL
BUN: 11 mg/dL (ref 6–23)
CHLORIDE: 105 meq/L (ref 96–112)
CO2: 29 mEq/L (ref 19–32)
CREATININE: 0.79 mg/dL (ref 0.40–1.20)
Calcium: 10.4 mg/dL (ref 8.4–10.5)
GFR: 80.12 mL/min (ref >60.00)
Glucose, Bld: 94 mg/dL (ref 70–99)
Potassium: 5.3 mEq/L — ABNORMAL HIGH (ref 3.5–5.1)
Sodium: 140 mEq/L (ref 135–145)

## 2015-08-31 ENCOUNTER — Other Ambulatory Visit (INDEPENDENT_AMBULATORY_CARE_PROVIDER_SITE_OTHER): Payer: BC Managed Care – PPO

## 2015-08-31 DIAGNOSIS — Z0000 Encounter for general adult medical examination without abnormal findings: Secondary | ICD-10-CM | POA: Diagnosis not present

## 2015-08-31 LAB — LIPID PANEL
Cholesterol: 237 mg/dL — ABNORMAL HIGH (ref 0–200)
HDL: 74.5 mg/dL (ref >39.00)
LDL Cholesterol: 149 mg/dL — ABNORMAL HIGH (ref 0–99)
NonHDL: 162.33
TRIGLYCERIDES: 65 mg/dL (ref 0.0–149.0)
Total CHOL/HDL Ratio: 3
VLDL: 13 mg/dL (ref 0.0–40.0)

## 2015-08-31 LAB — BASIC METABOLIC PANEL
BUN: 9 mg/dL (ref 6–23)
CO2: 28 mEq/L (ref 19–32)
Calcium: 9.8 mg/dL (ref 8.4–10.5)
Chloride: 104 mEq/L (ref 96–112)
Creatinine, Ser: 0.77 mg/dL (ref 0.40–1.20)
GFR: 82.42 mL/min (ref >60.00)
GLUCOSE: 81 mg/dL (ref 70–99)
POTASSIUM: 5.3 meq/L — AB (ref 3.5–5.1)
Sodium: 139 mEq/L (ref 135–145)

## 2015-08-31 LAB — CBC WITH DIFFERENTIAL/PLATELET
Basophils Absolute: 0 10*3/uL (ref 0.0–0.1)
Basophils Relative: 0.7 % (ref 0.0–3.0)
EOS PCT: 5 % (ref 0.0–5.0)
Eosinophils Absolute: 0.3 10*3/uL (ref 0.0–0.7)
HCT: 38 % (ref 36.0–46.0)
Hemoglobin: 12.6 g/dL (ref 12.0–15.0)
Lymphocytes Relative: 24.7 % (ref 12.0–46.0)
Lymphs Abs: 1.6 10*3/uL (ref 0.7–4.0)
MCHC: 33.2 g/dL (ref 30.0–36.0)
MCV: 89.3 fl (ref 78.0–100.0)
MONO ABS: 0.5 10*3/uL (ref 0.1–1.0)
MONOS PCT: 7.2 % (ref 3.0–12.0)
NEUTROS ABS: 3.9 10*3/uL (ref 1.4–7.7)
Neutrophils Relative %: 62.4 % (ref 43.0–77.0)
Platelets: 264 10*3/uL (ref 150.0–400.0)
RBC: 4.25 Mil/uL (ref 3.87–5.11)
RDW: 14 % (ref 11.5–15.5)
WBC: 6.3 10*3/uL (ref 4.0–10.5)

## 2015-08-31 LAB — HEPATIC FUNCTION PANEL
ALBUMIN: 4.5 g/dL (ref 3.5–5.2)
ALT: 11 U/L (ref 0–35)
AST: 15 U/L (ref 0–37)
Alkaline Phosphatase: 77 U/L (ref 39–117)
Bilirubin, Direct: 0.1 mg/dL (ref 0.0–0.3)
Total Bilirubin: 0.6 mg/dL (ref 0.2–1.2)
Total Protein: 6.9 g/dL (ref 6.0–8.3)

## 2015-08-31 LAB — TSH: TSH: 2 u[IU]/mL (ref 0.35–4.50)

## 2015-09-09 ENCOUNTER — Ambulatory Visit (INDEPENDENT_AMBULATORY_CARE_PROVIDER_SITE_OTHER): Payer: BC Managed Care – PPO | Admitting: Family Medicine

## 2015-09-09 VITALS — BP 100/70 | HR 67 | Temp 98.1°F | Ht 66.75 in | Wt 128.4 lb

## 2015-09-09 DIAGNOSIS — Z0000 Encounter for general adult medical examination without abnormal findings: Secondary | ICD-10-CM | POA: Diagnosis not present

## 2015-09-09 NOTE — Progress Notes (Signed)
Pre visit review using our clinic review tool, if applicable. No additional management support is needed unless otherwise documented below in the visit note. 

## 2015-09-09 NOTE — Progress Notes (Signed)
   Subjective:    Patient ID: Natalie Carpenter, female    DOB: 09/10/1960, 55 y.o.   MRN: [id number]  HPI Patient here for physical exam. Generally very healthy. No chronic medical problems. Takes no regular medications. She's never had screening colonoscopy. Her tetanus and other immunizations are up-to-date. She had Pap smear per GYN in October as well as mammogram then that was normal. She continues to play tennis regularly. Never smoked.   Family history and past medical history reviewed and as below with no major changes  No past medical history on file. Past Surgical History  Procedure Laterality Date  . Hernia repair  [id number]    umbilical hernia    reports that she has never smoked. She has never used smokeless tobacco. She reports that she drinks alcohol. She reports that she does not use illicit drugs. family history includes Cancer in her father; Sarcoidosis in her sister. Allergies  Allergen Reactions  . Sulfonamide Derivatives     REACTION: rash      Review of Systems  Constitutional: Negative for fever, activity change, appetite change, fatigue and unexpected weight change.  HENT: Negative for ear pain, hearing loss, sore throat and trouble swallowing.   Eyes: Negative for visual disturbance.  Respiratory: Negative for cough and shortness of breath.   Cardiovascular: Negative for chest pain and palpitations.  Gastrointestinal: Negative for abdominal pain, diarrhea, constipation and blood in stool.  Genitourinary: Negative for dysuria and hematuria.  Musculoskeletal: Negative for myalgias, back pain and arthralgias.  Skin: Negative for rash.  Neurological: Negative for dizziness, syncope and headaches.  Hematological: Negative for adenopathy.  Psychiatric/Behavioral: Negative for confusion and dysphoric mood.       Objective:   Physical Exam  Constitutional: She is oriented to person, place, and time. She appears well-developed and well-nourished.  HENT:  Head:  Normocephalic and atraumatic.  Eyes: EOM are normal. Pupils are equal, round, and reactive to light.  Neck: Normal range of motion. Neck supple. No thyromegaly present.  Cardiovascular: Normal rate, regular rhythm and normal heart sounds.   No murmur heard. Pulmonary/Chest: Breath sounds normal. No respiratory distress. She has no wheezes. She has no rales.  Abdominal: Soft. Bowel sounds are normal. She exhibits no distension and no mass. There is no tenderness. There is no rebound and no guarding.  Genitourinary:  Per GYN  Musculoskeletal: Normal range of motion. She exhibits no edema.  Lymphadenopathy:    She has no cervical adenopathy.  Neurological: She is alert and oriented to person, place, and time. She displays normal reflexes. No cranial nerve deficit.  Skin: No rash noted.  Psychiatric: She has a normal mood and affect. Her behavior is normal. Judgment and thought content normal.          Assessment & Plan:   Physical exam. Labs reviewed. Mildly elevated cholesterol but overall very low risk of CAD. 10 year risk is 1.2%. No recommendation for statins. Continue regular weightbearing exercise. Set up screening colonoscopy. She will continue GYN follow-up.

## 2015-10-11 LAB — HM COLONOSCOPY

## 2015-10-13 ENCOUNTER — Encounter: Payer: Self-pay | Admitting: Family Medicine

## 2016-01-31 ENCOUNTER — Telehealth: Payer: Self-pay | Admitting: Family Medicine

## 2016-01-31 NOTE — Telephone Encounter (Signed)
I agree.  Zithromax has increasing resistance and is not advised for many URIs (ie sinusitis) now.  I would not advise taking any antibiotics unless absolutely necessary.

## 2016-01-31 NOTE — Telephone Encounter (Signed)
Pt is traveling to [place] and out of the country and would like to have a Zpac to take with her.  Pt do not want to get sick from the plane.  Pharm: [facility].

## 2016-01-31 NOTE — Telephone Encounter (Signed)
Typically pt needs to be seen for antibiotics but I wanted you to advise incase you had an exception for this patient.

## 2016-02-01 MED ORDER — AZITHROMYCIN 250 MG PO TABS: ORAL_TABLET | ORAL | 0 refills | Status: DC

## 2016-02-01 NOTE — Telephone Encounter (Signed)
After verbally speaking with Dr. Elease Hashimoto, Medication sent in to pharmacy. She is aware not to take unless needed.

## 2016-02-01 NOTE — Telephone Encounter (Signed)
Pt states she is going out of the country ([place]) 10 days, and really wants to take this with her. Pt states she will not take unless absolutely necessary.

## 2016-09-06 ENCOUNTER — Ambulatory Visit (INDEPENDENT_AMBULATORY_CARE_PROVIDER_SITE_OTHER): Payer: BC Managed Care – PPO | Admitting: Family Medicine

## 2016-09-06 ENCOUNTER — Telehealth: Payer: Self-pay | Admitting: Family Medicine

## 2016-09-06 VITALS — BP 110/70 | HR 58 | Temp 98.4°F | Wt 133.1 lb

## 2016-09-06 DIAGNOSIS — J029 Acute pharyngitis, unspecified: Secondary | ICD-10-CM | POA: Diagnosis not present

## 2016-09-06 DIAGNOSIS — J069 Acute upper respiratory infection, unspecified: Secondary | ICD-10-CM

## 2016-09-06 DIAGNOSIS — B9789 Other viral agents as the cause of diseases classified elsewhere: Secondary | ICD-10-CM | POA: Diagnosis not present

## 2016-09-06 LAB — POCT RAPID STREP A (OFFICE): Rapid Strep A Screen: NEGATIVE

## 2016-09-06 NOTE — Progress Notes (Signed)
Pre visit review using our clinic review tool, if applicable. No additional management support is needed unless otherwise documented below in the visit note. 

## 2016-09-06 NOTE — Telephone Encounter (Signed)
Patient Name: Natalie Carpenter  DOB: 05/27/1961    Initial Comment Caller states she has chest congestion, coughing up yellow. Body aches, sore throat. Headache   Nurse Assessment  Nurse: Mallie Mussel, RN, Alveta Heimlich Date/Time Eilene Ghazi Time): 09/06/2016 8:34:17 AM  Confirm and document reason for call. If symptomatic, describe symptoms. ---Caller states that she has chest congestion, coughing up yellow mucus, sore throat, body aches and headache. Most of her symptoms began on Sunday. They have become worse. She rates her worst pain as 2-3 on 0-10 scale and her throat hurts the worst. Denies fever. 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 She does not see any white spots at the back of her throat. Denies difficulty breathing.  Does the patient have any new or worsening symptoms? ---Yes  Will a triage be completed? ---Yes  Related visit to physician within the last 2 weeks? ---No  Does the PT have any chronic conditions? (i.e. diabetes, asthma, etc.) ---No  Is this a behavioral health or substance abuse call? ---No     Guidelines    Guideline Title Affirmed Question Affirmed Notes  Common Cold Cold with no complications (all triage questions negative)    Final Disposition User   [place], RN, Alveta Heimlich    Comments  After the triage, caller asked if she could have Dr. Erick Blinks nurse to call her back. She feels that with the yellow mucus production that there is an infection going on. I asked if she wanted to see the doctor and that is when she asked to have Dr. Erick Blinks nurse to call her back.   Disagree/Comply: Comply

## 2016-09-06 NOTE — Progress Notes (Signed)
Subjective:     Patient ID: Natalie Carpenter, female   DOB: Feb 29, 1961, 56 y.o.   MRN: [id number]  HPI Acute visit for upper respiratory symptoms. Onset last Saturday. She's had some productive cough, body aches, sore throat, nasal congestion. She works as a Print production planner. She is not aware of any fever. Denies any nausea, vomiting, or diarrhea.  No past medical history on file. Past Surgical History:  Procedure Laterality Date  . HERNIA REPAIR  6438   umbilical hernia    reports that she has never smoked. She has never used smokeless tobacco. She reports that she drinks alcohol. She reports that she does not use drugs. family history includes Cancer in her father; Sarcoidosis in her sister. Allergies  Allergen Reactions  . Sulfonamide Derivatives     REACTION: rash     Review of Systems  Constitutional: Positive for fatigue. Negative for chills and fever.  HENT: Positive for congestion and sore throat.   Respiratory: Positive for cough.        Objective:   Physical Exam  Constitutional: She appears well-developed and well-nourished.  HENT:  Right Ear: External ear normal.  Left Ear: External ear normal.  Mouth/Throat: Oropharynx is clear and moist.  Neck: Neck supple.  Cardiovascular: Normal rate and regular rhythm.   Pulmonary/Chest: Effort normal and breath sounds normal. No respiratory distress. She has no wheezes. She has no rales.  Lymphadenopathy:    She has no cervical adenopathy.  Skin: No rash noted.       Assessment:     Probable viral URI with cough. Rapid strep negative    Plan:     -No clear indication for antibiotics at this time. -Stay well-hydrated and get plenty of rest -Consider over-the-counter plain Mucinex -Follow-up for any fever or worsening symptoms.  Eulas Post MD [facility]

## 2016-09-06 NOTE — Telephone Encounter (Signed)
Spoke with pt and she wanted to schedule appt to be evaluated for possible strep and flu. Appt scheduled with Dr Elease Hashimoto for this afternoon. Nothing further needed at this time.

## 2016-09-06 NOTE — Patient Instructions (Signed)
Upper Respiratory Infection, Adult Most upper respiratory infections (URIs) are a viral infection of the air passages leading to the lungs. A URI affects the nose, throat, and upper air passages. The most common type of URI is nasopharyngitis and is typically referred to as "the common cold." URIs run their course and usually go away on their own. Most of the time, a URI does not require medical attention, but sometimes a bacterial infection in the upper airways can follow a viral infection. This is called a secondary infection. Sinus and middle ear infections are common types of secondary upper respiratory infections. Bacterial pneumonia can also complicate a URI. A URI can worsen asthma and chronic obstructive pulmonary disease (COPD). Sometimes, these complications can require emergency medical care and may be life threatening. What are the causes? Almost all URIs are caused by viruses. A virus is a type of germ and can spread from one person to another. What increases the risk? You may be at risk for a URI if:  You smoke.  You have chronic heart or lung disease.  You have a weakened defense (immune) system.  You are very young or very old.  You have nasal allergies or asthma.  You work in crowded or poorly ventilated areas.  You work in health care facilities or schools.  What are the signs or symptoms? Symptoms typically develop 2-3 days after you come in contact with a cold virus. Most viral URIs last 7-10 days. However, viral URIs from the influenza virus (flu virus) can last 14-18 days and are typically more severe. Symptoms may include:  Runny or stuffy (congested) nose.  Sneezing.  Cough.  Sore throat.  Headache.  Fatigue.  Fever.  Loss of appetite.  Pain in your forehead, behind your eyes, and over your cheekbones (sinus pain).  Muscle aches.  How is this diagnosed? Your health care provider may diagnose a URI by:  Physical exam.  Tests to check that your  symptoms are not due to another condition such as: ? Strep throat. ? Sinusitis. ? Pneumonia. ? Asthma.  How is this treated? A URI goes away on its own with time. It cannot be cured with medicines, but medicines may be prescribed or recommended to relieve symptoms. Medicines may help:  Reduce your fever.  Reduce your cough.  Relieve nasal congestion.  Follow these instructions at home:  Take medicines only as directed by your health care provider.  Gargle warm saltwater or take cough drops to comfort your throat as directed by your health care provider.  Use a warm mist humidifier or inhale steam from a shower to increase air moisture. This may make it easier to breathe.  Drink enough fluid to keep your urine clear or pale yellow.  Eat soups and other clear broths and maintain good nutrition.  Rest as needed.  Return to work when your temperature has returned to normal or as your health care provider advises. You may need to stay home longer to avoid infecting others. You can also use a face mask and careful hand washing to prevent spread of the virus.  Increase the usage of your inhaler if you have asthma.  Do not use any tobacco products, including cigarettes, chewing tobacco, or electronic cigarettes. If you need help quitting, ask your health care provider. How is this prevented? The best way to protect yourself from getting a cold is to practice good hygiene.  Avoid oral or hand contact with people with cold symptoms.  Wash your   hands often if contact occurs.  There is no clear evidence that vitamin C, vitamin E, echinacea, or exercise reduces the chance of developing a cold. However, it is always recommended to get plenty of rest, exercise, and practice good nutrition. Contact a health care provider if:  You are getting worse rather than better.  Your symptoms are not controlled by medicine.  You have chills.  You have worsening shortness of breath.  You have  brown or red mucus.  You have yellow or brown nasal discharge.  You have pain in your face, especially when you bend forward.  You have a fever.  You have swollen neck glands.  You have pain while swallowing.  You have white areas in the back of your throat. Get help right away if:  You have severe or persistent: ? Headache. ? Ear pain. ? Sinus pain. ? Chest pain.  You have chronic lung disease and any of the following: ? Wheezing. ? Prolonged cough. ? Coughing up blood. ? A change in your usual mucus.  You have a stiff neck.  You have changes in your: ? Vision. ? Hearing. ? Thinking. ? Mood. This information is not intended to replace advice given to you by your health care provider. Make sure you discuss any questions you have with your health care provider. Document Released: 11/22/2000 Document Revised: 01/30/2016 Document Reviewed: 09/03/2013 Elsevier Interactive Patient Education  2017 Elsevier Inc.  

## 2016-09-11 ENCOUNTER — Telehealth: Payer: Self-pay | Admitting: Family Medicine

## 2016-09-11 ENCOUNTER — Ambulatory Visit (INDEPENDENT_AMBULATORY_CARE_PROVIDER_SITE_OTHER): Payer: BC Managed Care – PPO | Admitting: Family Medicine

## 2016-09-11 VITALS — BP 110/70 | HR 66 | Temp 98.8°F | Wt 135.4 lb

## 2016-09-11 DIAGNOSIS — J019 Acute sinusitis, unspecified: Secondary | ICD-10-CM

## 2016-09-11 MED ORDER — CEFUROXIME AXETIL 250 MG PO TABS: 250.0000 mg | ORAL_TABLET | Freq: Two times a day (BID) | ORAL | 0 refills | Status: DC

## 2016-09-11 NOTE — Progress Notes (Signed)
Pre visit review using our clinic review tool, if applicable. No additional management support is needed unless otherwise documented below in the visit note. 

## 2016-09-11 NOTE — Telephone Encounter (Signed)
Noted  

## 2016-09-11 NOTE — Telephone Encounter (Signed)
Patient Name: Natalie Carpenter  DOB: [birth year]    Initial Comment Caller states that she thinks she may have the flu. She has a lot of congestion.    Nurse Assessment  Nurse: Raphael Gibney, RN, Vanita Ingles Date/Time (Eastern Time): 09/11/2016 1:34:52 PM  Confirm and document reason for call. If symptomatic, describe symptoms. ---Caller states she thinks she has the flu. Has nasal congestion with sinus pressure with greenish yellow nasal congestion. Took a Z pak over the weekend that was left over. She was in the office last Wednesday and strep test was negative. No fever. Has missed work for over a week.  Does the patient have any new or worsening symptoms? ---Yes  Will a triage be completed? ---Yes  Related visit to physician within the last 2 weeks? ---Yes  Does the PT have any chronic conditions? (i.e. diabetes, asthma, etc.) ---No  Is this a behavioral health or substance abuse call? ---No     Guidelines    Guideline Title Affirmed Question Affirmed Notes  Sinus Pain or Congestion Lots of coughing    Final Disposition User   See Physician within 24 Hours [place], RN, Vera    Comments  triage outcome upgraded to see physician within 24 hrs as pt did not sleep last night due to cough and has not been able to go to work for over a week.  pt states she already has appt at 4 pm today.   Referrals  REFERRED TO PCP OFFICE   Disagree/Comply: Comply

## 2016-09-11 NOTE — Progress Notes (Signed)
Subjective:     Patient ID: Natalie Carpenter, female   DOB: 1960/07/13, 56 y.o.   MRN: [id number]  HPI Patient seen for follow-up regarding recent URI. We suspected viral infection and no antibiotics prescribed. She presents today feeling somewhat worse. She's had cough productive of green sputum and also some greenish nasal discharge. She started herself on Zithromax Friday and is on day 4 today. She has increased fatigue. Frontal and maxillary sinus pressure. Denies any fevers or chills. No nausea or vomiting. She has plans trip to [place] in a couple days is concerned about being sick on that trip.  No past medical history on file. Past Surgical History:  Procedure Laterality Date  . HERNIA REPAIR  6015   umbilical hernia    reports that she has never smoked. She has never used smokeless tobacco. She reports that she drinks alcohol. She reports that she does not use drugs. family history includes Cancer in her father; Sarcoidosis in her sister. Allergies  Allergen Reactions  . Sulfonamide Derivatives     REACTION: rash     Review of Systems  Constitutional: Positive for fatigue. Negative for chills and fever.  HENT: Positive for congestion, sinus pain and sinus pressure.   Respiratory: Positive for cough. Negative for shortness of breath and wheezing.        Objective:   Physical Exam  Constitutional: She appears well-developed and well-nourished.  HENT:  Right Ear: External ear normal.  Left Ear: External ear normal.  Mouth/Throat: Oropharynx is clear and moist. No oropharyngeal exudate.  Neck: Neck supple.  Cardiovascular: Normal rate and regular rhythm.   Pulmonary/Chest: Effort normal and breath sounds normal. No respiratory distress. She has no wheezes. She has no rales.  Lymphadenopathy:    She has no cervical adenopathy.       Assessment:     Possible acute sinusitis versus viral illness. Patient already started on Zithromax from leftover prescription    Plan:     -Finish out Zithromax -Stay well-hydrated -If not continuing to improve over the next couple days start Ceftin 250 mg twice daily for 10 days. She's had previous abdominal cramping with Augmentin.  Eulas Post MD [facility] at [facility]

## 2016-09-13 ENCOUNTER — Telehealth: Payer: Self-pay | Admitting: Family Medicine

## 2016-09-13 ENCOUNTER — Encounter: Payer: Self-pay | Admitting: Family Medicine

## 2016-09-13 MED ORDER — BENZONATATE 200 MG PO CAPS: 200.0000 mg | ORAL_CAPSULE | Freq: Three times a day (TID) | ORAL | 0 refills | Status: DC | PRN

## 2016-09-13 MED ORDER — NYSTATIN 100000 UNIT/ML MT SUSP: 10.0000 mL | Freq: Four times a day (QID) | OROMUCOSAL | 0 refills | Status: DC

## 2016-09-13 NOTE — Telephone Encounter (Signed)
I gave permission to send in Nystatin oral suspension 10 ml swish, gargle, and spit qid prn (though thrush would be very unlikely to develop in 2 days of antibiotic use in low risk pt).

## 2016-09-13 NOTE — Telephone Encounter (Signed)
Spoke to the pt.  Dr. Elease Hashimoto treated pt on 09/11/16 for acute sinusitis. She complains of an odd sensation on her tongue and some soreness.  She has white patches.  Believes she has thrush.  Would like medication sent to the pharmacy sent to [facility] at [facility].  Pt is leaving tomorrow by plane at 9:30 to [place] and needs rx asap.  Please advise.  Thanks!!

## 2016-09-13 NOTE — Telephone Encounter (Signed)
° ° ° ° °  Pt call to say she need something for a cough. Would like a call back . Pt said she is leaving tomorrow and would like to have the rx today       Pharmacy  [facility] [employer]

## 2016-09-13 NOTE — Telephone Encounter (Signed)
rx called and sent in.  Patient is aware

## 2016-09-13 NOTE — Telephone Encounter (Signed)
Rx sent and patient is aware. 

## 2016-09-13 NOTE — Telephone Encounter (Signed)
Let's try Tessalon perles 200 mg po q 8 hours prn cough  #30

## 2016-09-14 ENCOUNTER — Other Ambulatory Visit: Payer: Self-pay | Admitting: Adult Health

## 2016-09-14 ENCOUNTER — Ambulatory Visit: Payer: BC Managed Care – PPO | Admitting: Adult Health

## 2016-09-25 ENCOUNTER — Encounter: Payer: Self-pay | Admitting: Family Medicine

## 2016-09-25 ENCOUNTER — Ambulatory Visit (INDEPENDENT_AMBULATORY_CARE_PROVIDER_SITE_OTHER): Payer: BC Managed Care – PPO | Admitting: Family Medicine

## 2016-09-25 ENCOUNTER — Ambulatory Visit (INDEPENDENT_AMBULATORY_CARE_PROVIDER_SITE_OTHER)
Admission: RE | Admit: 2016-09-25 | Discharge: 2016-09-25 | Disposition: A | Discharge status: Home / Self Care | Payer: BC Managed Care – PPO | Source: Ambulatory Visit | Attending: Family Medicine | Admitting: Family Medicine

## 2016-09-25 VITALS — BP 98/68 | HR 66 | Temp 97.7°F | Wt 134.5 lb

## 2016-09-25 DIAGNOSIS — R05 Cough: Secondary | ICD-10-CM | POA: Diagnosis not present

## 2016-09-25 DIAGNOSIS — R5383 Other fatigue: Secondary | ICD-10-CM | POA: Diagnosis not present

## 2016-09-25 DIAGNOSIS — R059 Cough, unspecified: Secondary | ICD-10-CM

## 2016-09-25 LAB — CBC WITH DIFFERENTIAL/PLATELET
Basophils Absolute: 0 10*3/uL (ref 0.0–0.1)
Basophils Relative: 0.8 % (ref 0.0–3.0)
EOS ABS: 0.1 10*3/uL (ref 0.0–0.7)
Eosinophils Relative: 2.3 % (ref 0.0–5.0)
HCT: 38.1 % (ref 36.0–46.0)
HEMOGLOBIN: 12.6 g/dL (ref 12.0–15.0)
Lymphocytes Relative: 24.1 % (ref 12.0–46.0)
Lymphs Abs: 1.2 10*3/uL (ref 0.7–4.0)
MCHC: 33.2 g/dL (ref 30.0–36.0)
MCV: 89.9 fl (ref 78.0–100.0)
Monocytes Absolute: 0.7 10*3/uL (ref 0.1–1.0)
Monocytes Relative: 14.4 % — ABNORMAL HIGH (ref 3.0–12.0)
Neutro Abs: 2.8 10*3/uL (ref 1.4–7.7)
Neutrophils Relative %: 58.4 % (ref 43.0–77.0)
Platelets: 261 10*3/uL (ref 150.0–400.0)
RBC: 4.23 Mil/uL (ref 3.87–5.11)
RDW: 13.6 % (ref 11.5–15.5)
WBC: 4.8 10*3/uL (ref 4.0–10.5)

## 2016-09-25 NOTE — Progress Notes (Signed)
Pre visit review using our clinic review tool, if applicable. No additional management support is needed unless otherwise documented below in the visit note. 

## 2016-09-25 NOTE — Progress Notes (Signed)
Subjective:     Patient ID: Natalie Carpenter, female   DOB: Jan 20, 1961, 56 y.o.   MRN: [id number]  HPI Patient seen with recent cough. She is still having some cough though not waking up much at night. Onset about 3 weeks ago.  She's been extremely fatigued but feels she is sleeping fairly well. Not playing much tennis recently. We recently prescribed course of Ceftin but she never started on this. Her cough had been productive of colored sputum now mostly clear sputum. Denies any night sweats. No appetite changes or weight loss. Never smoked. She is concerned because it seems to be taking a long time to get over completely. She was concerned recently that she may have had some thrush and she started nystatin suspension and those symptoms are improved.  No past medical history on file. Past Surgical History:  Procedure Laterality Date  . HERNIA REPAIR  9741   umbilical hernia    reports that she has never smoked. She has never used smokeless tobacco. She reports that she drinks alcohol. She reports that she does not use drugs. family history includes Cancer in her father; Sarcoidosis in her sister. Allergies  Allergen Reactions  . Sulfonamide Derivatives     REACTION: rash     Review of Systems  Constitutional: Positive for fatigue. Negative for appetite change, chills, fever and unexpected weight change.  Respiratory: Positive for cough.   Cardiovascular: Negative for chest pain.       Objective:   Physical Exam  Constitutional: She appears well-developed and well-nourished.  HENT:  Mouth/Throat: Oropharynx is clear and moist.  Neck: Neck supple.  Cardiovascular: Normal rate and regular rhythm.   Pulmonary/Chest: Effort normal and breath sounds normal. No respiratory distress. She has no wheezes. She has no rales.  Lymphadenopathy:    She has no cervical adenopathy.       Assessment:     Recent respiratory illness. Suspect acute bronchitis.  increased fatigue.  She has  nonfocal exam at this time    Plan:     -We explained is not unusual for acute bronchitis to last 3-4 weeks -Obtain CBC and chest x-ray to further evaluate -Follow-up promptly for any fever or other change of symptoms  Eulas Post MD [place] [facility] [facility] at [facility]

## 2016-09-26 ENCOUNTER — Encounter: Payer: Self-pay | Admitting: Family Medicine

## 2016-10-02 ENCOUNTER — Encounter: Payer: Self-pay | Admitting: *Deleted

## 2016-10-02 DIAGNOSIS — D6852 Prothrombin gene mutation: Secondary | ICD-10-CM | POA: Insufficient documentation

## 2016-10-06 ENCOUNTER — Ambulatory Visit (INDEPENDENT_AMBULATORY_CARE_PROVIDER_SITE_OTHER): Payer: BC Managed Care – PPO | Admitting: Family Medicine

## 2016-10-06 ENCOUNTER — Encounter: Payer: Self-pay | Admitting: Family Medicine

## 2016-10-06 VITALS — BP 110/68 | HR 64 | Temp 98.2°F | Wt 136.1 lb

## 2016-10-06 DIAGNOSIS — R5383 Other fatigue: Secondary | ICD-10-CM

## 2016-10-06 DIAGNOSIS — R05 Cough: Secondary | ICD-10-CM | POA: Diagnosis not present

## 2016-10-06 DIAGNOSIS — Z1159 Encounter for screening for other viral diseases: Secondary | ICD-10-CM | POA: Diagnosis not present

## 2016-10-06 DIAGNOSIS — R059 Cough, unspecified: Secondary | ICD-10-CM

## 2016-10-06 DIAGNOSIS — R0781 Pleurodynia: Secondary | ICD-10-CM | POA: Diagnosis not present

## 2016-10-06 LAB — COMPREHENSIVE METABOLIC PANEL
ALK PHOS: 66 U/L (ref 33–130)
ALT: 10 U/L (ref 6–29)
AST: 13 U/L (ref 10–35)
Albumin: 4.3 g/dL (ref 3.6–5.1)
BUN: 12 mg/dL (ref 7–25)
CO2: 24 mmol/L (ref 20–31)
Calcium: 9.8 mg/dL (ref 8.6–10.4)
Chloride: 107 mmol/L (ref 98–110)
Creat: 0.81 mg/dL (ref 0.50–1.05)
Glucose, Bld: 96 mg/dL (ref 65–99)
Potassium: 4.9 mmol/L (ref 3.5–5.3)
SODIUM: 142 mmol/L (ref 135–146)
Total Bilirubin: 0.3 mg/dL (ref 0.2–1.2)
Total Protein: 6.7 g/dL (ref 6.1–8.1)

## 2016-10-06 NOTE — Progress Notes (Signed)
Pre visit review using our clinic review tool, if applicable. No additional management support is needed unless otherwise documented below in the visit note. 

## 2016-10-06 NOTE — Progress Notes (Signed)
Subjective:     Patient ID: Natalie Carpenter, female   DOB: 1960-11-20, 56 y.o.   MRN: [id number]  HPI Patient had recent cough and continues to have somewhat intermittent cough. This is occasionally productive of clear sputum. No wheezing. Rare GERD symptoms. Her major complaint is fatigue. She is generally sleeping okay at night. She's taken frequent daytime naps was still feels fatigued. She's not any appetite changes. No weight changes. Denies any night sweats.  Not playing any recent tennis secondary to fatigue.  Also complains of left-sided chest pain which is worse with movement and deep breathing and worse with cough. This pain is up underneath the left breast region. She had chest x-ray 09/25/16 which showed no acute findings. 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 Nonsmoker. No exertional chest pain. She's been much less active with tennis because of her pain  No past medical history on file. Past Surgical History:  Procedure Laterality Date  . HERNIA REPAIR  2633   umbilical hernia    reports that she has never smoked. She has never used smokeless tobacco. She reports that she drinks alcohol. She reports that she does not use drugs. family history includes Cancer in her father; Sarcoidosis in her sister. Allergies  Allergen Reactions  . Sulfonamide Derivatives     REACTION: rash     Review of Systems  Constitutional: Positive for fatigue. Negative for appetite change, chills, fever and unexpected weight change.  HENT: Negative for sore throat and trouble swallowing.   Respiratory: Positive for cough. Negative for shortness of breath.   Gastrointestinal: Negative for abdominal pain.  Endocrine: Negative for polydipsia and polyuria.  Genitourinary: Negative for dysuria.  Neurological: Negative for dizziness.  Hematological: Negative for adenopathy. Does not bruise/bleed easily.       Objective:   Physical Exam   Constitutional: She appears well-developed and well-nourished.  HENT:  Mouth/Throat: Oropharynx is clear and moist. No oropharyngeal exudate.  Neck: Neck supple. No thyromegaly present.  Cardiovascular: Normal rate and regular rhythm.  Exam reveals no gallop and no friction rub.   No murmur heard. Pulmonary/Chest: Effort normal and breath sounds normal. No respiratory distress. She has no wheezes. She has no rales.  Abdominal: Soft. Bowel sounds are normal. She exhibits no mass. There is no tenderness. There is no rebound and no guarding.  Musculoskeletal: She exhibits no edema.  Lymphadenopathy:    She has no cervical adenopathy.  Skin: No rash noted.       Assessment:     Patient presents with pervasive fatigue and some persistent cough along with left sided chest pain. Suspect her chest pain is most likely musculoskeletal. This worse with deep breathing and movement. She does not have any tachycardia, hypoxemia, or other clinical findings to suggest likely PE.    Plan:     -She had recent CBC that was normal. Check further labs with TSH, comprehensive metabolic panel, d-dimer,  EBV antibodies. -Try heat or ice for chest wall pain. She has already tried nonsteroidals without much improvement. -We discussed hepatitis C screenings she's not had this previously-but is low risk.  Eulas Post MD [place] [facility] at [facility]

## 2016-10-07 LAB — TSH: TSH: 1.68 m[IU]/L

## 2016-10-07 LAB — SEDIMENTATION RATE: Sed Rate: 10 mm/hr (ref 0–30)

## 2016-10-07 LAB — HEPATITIS C ANTIBODY: HCV Ab: NEGATIVE

## 2016-10-07 LAB — D-DIMER, QUANTITATIVE (NOT AT ARMC): D DIMER QUANT: 0.26 ug{FEU}/mL (ref <0.50)

## 2016-10-08 ENCOUNTER — Encounter: Payer: Self-pay | Admitting: Family Medicine

## 2016-10-09 LAB — EPSTEIN-BARR VIRUS VCA, IGM

## 2016-10-09 LAB — EPSTEIN-BARR VIRUS VCA, IGG: EBV VCA IGG: 471 U/mL — AB

## 2016-10-17 ENCOUNTER — Encounter: Payer: BC Managed Care – PPO | Admitting: Family Medicine

## 2017-02-05 ENCOUNTER — Ambulatory Visit (INDEPENDENT_AMBULATORY_CARE_PROVIDER_SITE_OTHER): Payer: BC Managed Care – PPO | Admitting: Sports Medicine

## 2017-02-05 ENCOUNTER — Ambulatory Visit
Admission: RE | Admit: 2017-02-05 | Discharge: 2017-02-05 | Disposition: A | Discharge status: Home / Self Care | Payer: BC Managed Care – PPO | Source: Ambulatory Visit | Attending: Sports Medicine | Admitting: Sports Medicine

## 2017-02-05 ENCOUNTER — Other Ambulatory Visit: Payer: Self-pay | Admitting: *Deleted

## 2017-02-05 VITALS — BP 106/70 | Ht 66.5 in | Wt 130.0 lb

## 2017-02-05 DIAGNOSIS — M25552 Pain in left hip: Secondary | ICD-10-CM

## 2017-02-05 MED ORDER — MELOXICAM 15 MG PO TABS: 15.0000 mg | ORAL_TABLET | Freq: Every day | ORAL | 0 refills | Status: DC

## 2017-02-05 NOTE — Assessment & Plan Note (Addendum)
Suspect some degree of arthritis of her hip given symptoms of radiating pain to the groin.  Unlikely bursitis as exam without tenderness to palpation over greater trochanter.  No prior history of falls/injury to the hip.   -will obtain imaging to evaluate for arthritic changes: A/P and lateral films ordered -Rx for Mobic 15 mg daily prn  -Recommended use ice/heat as needed

## 2017-02-05 NOTE — Progress Notes (Signed)
   Subjective:    Patient ID: Natalie Carpenter, female    DOB: Nov 04, 1960, 56 y.o.   MRN: [id number]  HPI 56 yo female presenting with left hip pain x 2 months duration. No prior history of fall or traumatic injury.  She reports her pain is aching in nature and sometimes radiates to the left groin area.  She has been traveling a lot more recently and has not been particularly active.  It is uncomfortable for her to lay on her left side when sleeping.   Has tried Motrin but has not provided much relief.   She has also tried massage and applying heating pad which have not helped.  Denies pain radiating to the leg.  Denies numbness, tingling.    Review of Systems  Musculoskeletal: Negative for joint swelling.  Neurological: Negative for weakness and numbness.      Objective:   Physical Exam 56 yo female, NAD. Sitting comfortably in exam room.  BP 106/70   Ht 5' 6.5" (1.689 m)   Wt 130 lb (59 kg)   LMP 09/03/2010   BMI 20.67 kg/m   Left Hip: ROM IR: 80 Deg, ER: 60 Deg, Flexion: 120 Deg, Extension: 100 Deg, Abduction: 45 Deg, Adduction: 45 Deg Strength IR: 5/5, ER: 4/5, Flexion: 5/5, Extension: 5/5, Abduction: 5/5, Adduction: 5/5  Pelvic alignment unremarkable to inspection and palpation.  Standing hip rotation and gait without trendelenburg / unsteadiness. Pain noted with external rotation; pain with resisted abduction.  Greater trochanter without tenderness to palpation. Mild tenderness over piriformis. Mild left SI joint tenderness; normal minimal SI movement.    Assessment & Plan:   L hip pain Suspect some degree of arthritis of her hip given symptoms of radiating pain to the groin.  Unlikely bursitis as exam without tenderness to palpation over greater trochanter.  No prior history of falls/injury to the hip.   -will obtain imaging to evaluate for arthritic changes: A/P and lateral films ordered -Rx for Mobic 15 mg daily prn  -Recommended use ice/heat as needed   I spoke with  the patient after reviewing her left hip x-ray. It is unremarkable. No arthritis. I recommend a trial of physical therapy with a follow-up appointment with me in 6 weeks. She will also use the meloxicam as needed. She is encouraged to call with questions or concerns prior to her follow-up visit.

## 2017-02-08 ENCOUNTER — Ambulatory Visit: Payer: BC Managed Care – PPO | Attending: Sports Medicine | Admitting: Physical Therapy

## 2017-02-08 DIAGNOSIS — M25552 Pain in left hip: Secondary | ICD-10-CM

## 2017-02-08 NOTE — Therapy (Deleted)
[place], [place], [address] Phone: [phone]   Fax:  [phone]  Physical Therapy Evaluation  Patient Details  Name: Natalie Carpenter MRN: [id number] Date of Birth: 1961/06/06 Referring Provider: Everlean Alstrom, DO  Encounter Date: 02/08/2017      PT End of Session - 02/08/17 1639    Visit Number 1   Number of Visits 16   Date for PT Re-Evaluation 04/05/17   PT Start Time 6203   PT Stop Time 1630   PT Time Calculation (min) 55 min   Activity Tolerance Patient tolerated treatment well;Patient limited by pain   Behavior During Therapy [facility] for tasks assessed/performed      History reviewed. No pertinent past medical history.  Past Surgical History:  Procedure Laterality Date  . HERNIA REPAIR  5597   umbilical hernia    There were no vitals filed for this visit.       Subjective Assessment - 02/08/17 1536    Subjective Pt states increased pain in L hip, over past 2 months. She states no injury to start pain. She had negative X-ray. Pt has significant pain at night time, due to position that she is sleeping in : (per her report hip is in significant flexion and IR).  She works as a Print production planner.    Limitations Walking   Patient Stated Goals Decreased pain, sleep without pain.    Currently in Pain? Yes   Pain Score 4    Pain Location Hip   Pain Orientation Left   Pain Descriptors / Indicators Aching   Pain Type Acute pain   Pain Onset 1 to 4 weeks ago   Pain Frequency Intermittent   Aggravating Factors  Sleeping, walking   Pain Relieving Factors Rest            [facility] PT Assessment - 02/08/17 1654      Assessment   Medical Diagnosis Left hip pain   Referring Provider Everlean Alstrom, DO   Next MD Visit none scheduled     Precautions   Precautions None     Balance Screen   Has the patient fallen in the past 6 months No     [place] residence     Prior Function   Level of Independence Independent     ROM / Strength   AROM / PROM / Strength AROM;Strength;PROM     AROM   Overall AROM Comments L: mild limitation for ER     PROM   Overall PROM Comments L: mild limitation for ER     Strength   Overall Strength Comments R: WFL // L: limited by pain   Strength Assessment Site Hip   Right/Left Hip Left   Left Hip Flexion 4-/5   Left Hip Extension 4-/5   Left Hip External Rotation 4-/5   Left Hip Internal Rotation 4-/5   Left Hip ABduction 4-/5     Flexibility   Soft Tissue Assessment /Muscle Length yes   Hamstrings tight bil   Piriformis tight/sore on L     Palpation   Palpation comment Painful Greater trochanter, TFL, glut med and piriformis.  Mild joint restrictions for ER, mild pain into L SI.      Special Tests    Special Tests --  Special tests inconclusive, all motions sore.             Objective measurements completed on examination: See above findings.  [place] [facility] PT Treatment/Exercise - 02/08/17 1700      Exercises   Exercises Knee/Hip     Knee/Hip Exercises: Stretches   Hip Flexor Stretch Left;3 reps;30 seconds   Hip Flexor Stretch Limitations Standing/Runners stretch   ITB Stretch Left;3 reps;30 seconds   ITB Stretch Limitations Supine with strap   Piriformis Stretch Left;3 reps;30 seconds   Piriformis Stretch Limitations Seated and supine fig 4      Manual Therapy   Manual Therapy Joint mobilization;Soft tissue mobilization;Passive ROM;Muscle Energy Technique   Joint Mobilization Posterior hip mobs, grade 3    Soft tissue mobilization Tennis ball to TFL   Muscle Energy Technique to increase anterior rotation of L pelvis                PT Education - 02/08/17 1638    Education provided Yes   Education Details HEP, rest, education on Dx.    Person(s) Educated Patient   Methods Explanation;Handout   Comprehension Verbalized understanding;Need further instruction           PT Short Term Goals - 02/08/17 1646      PT SHORT TERM GOAL #1   Title Pt to demo decreased pain in L hip, to 3/10    Time 2   Period Weeks   Status New   Target Date 02/22/17     PT SHORT TERM GOAL #2   Title Pt to be independent with initial HEP   Time 2   Period Weeks   Status New   Target Date 02/22/17           PT Long Term Goals - 02/08/17 1647      PT LONG TERM GOAL #1   Title Pt to report decreased pain in L hip, to 0-2/10 with activity and sleep   Time 8   Period Weeks   Status New   Target Date 04/05/17     PT LONG TERM GOAL #2   Title Pt to demo increased strength of L hip, to be at least 4+/5, to improve stability, and ability for ambulation   Time 8   Period Weeks   Status New   Target Date 04/05/17     PT LONG TERM GOAL #3   Title Pt to demo ability for ambulation, up to 1 mile, without increased pain greater than 2/10   Time 8   Period Weeks   Status New   Target Date 04/05/17                Plan - 02/08/17 1641    Clinical Impression Statement Pt presents with primary complaint of increased pain in L hip. She has soreness to palpate greater trochanter, TFL, groin, and glute. She has increased pain with passive IR, and soreness with resisted flexion. Pt with multiple sites of trigger points and soreness in surrounding musculature. She has mild ROM loss for ER compared to R side. Pt wtih decreased ability and endurance for ambulation and functional activities, due to pain. Pt to benefit from skilled care, to improve deficits, decrease pain, and return to functional activities and work duties without pain.    Clinical Presentation Stable   Clinical Decision Making Low   Rehab Potential Good   PT Frequency 2x / week   PT Duration 8 weeks   PT Treatment/Interventions ADLs/Self Care Home Management;Cryotherapy;Electrical Stimulation;Moist Heat;Ultrasound;Neuromuscular re-education;Balance training;Therapeutic exercise;Therapeutic  activities;Functional mobility training;Gait training;Patient/family education;Manual techniques;Passive range of motion;Dry needling;Taping   PT Next Visit Plan Review  HEP, progress stretches, add strengthening as pain decreases, Manual/hip mobs   PT Home Exercise Plan Piriformis stretch seated and supine; ITB with strap; MFR with tennis ball; Standing hip flexor stretch   Consulted and Agree with Plan of Care Patient      Patient will benefit from skilled therapeutic intervention in order to improve the following deficits and impairments:  Abnormal gait, Decreased activity tolerance, Decreased strength, Pain, Difficulty walking, Decreased range of motion, Impaired flexibility  Visit Diagnosis: Pain in left hip - Plan: PT plan of care cert/re-cert     Problem List Patient Active Problem List   Diagnosis Date Noted  . Left hip pain 02/05/2017  . 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 SINUSITIS, ACUTE 05/10/2009  . DYSFUNCTION OF EUSTACHIAN TUBE 12/09/2008  . LATERAL EPICONDYLITIS, LEFT 06/22/2008  . WEAKNESS, MUSCLE 10/19/2006  . DRY SKIN 10/19/2006  . CONTUSION, TOE 10/17/2006  . SHOULDER PAIN, RIGHT 09/21/2006  . NECK PAIN, CHRONIC 09/21/2006    Lyndee Hensen, PT, DPT 5:10 PM  02/08/17    [facility] [address] [place], [place], [address] Phone: [phone]   Fax:  [phone]  Name: Darcie Mellone MRN: [id number] Date of Birth: April 06, 1961

## 2017-02-09 NOTE — Therapy (Signed)
[place], [place], [address] Phone: [phone]   Fax:  [phone]  Physical Therapy Evaluation  Patient Details  Name: Natalie Carpenter MRN: [id number] Date of Birth: 15-Jun-1960 Referring Provider: Everlean Alstrom, DO  Encounter Date: 02/08/2017      PT End of Session - 02/08/17 1639    Visit Number 1   Number of Visits 16   Date for PT Re-Evaluation 04/05/17   PT Start Time 8413   PT Stop Time 1630   PT Time Calculation (min) 55 min   Activity Tolerance Patient tolerated treatment well;Patient limited by pain   Behavior During Therapy [facility] for tasks assessed/performed      History reviewed. No pertinent past medical history.  Past Surgical History:  Procedure Laterality Date  . HERNIA REPAIR  2440   umbilical hernia    There were no vitals filed for this visit.       Subjective Assessment - 02/08/17 1536    Subjective Pt states increased pain in L hip, over past 2 months. She states no injury to start pain. She had negative X-ray. Pt has significant pain at night time, due to position that she is sleeping in : (per her report hip is in significant flexion and IR).  She works as a Print production planner.    Limitations Walking   Patient Stated Goals Decreased pain, sleep without pain.    Currently in Pain? Yes   Pain Score 4    Pain Location Hip   Pain Orientation Left   Pain Descriptors / Indicators Aching   Pain Type Acute pain   Pain Onset 1 to 4 weeks ago   Pain Frequency Intermittent   Aggravating Factors  Sleeping, walking   Pain Relieving Factors Rest            [facility] PT Assessment - 02/08/17 1654      Assessment   Medical Diagnosis Left hip pain   Referring Provider Everlean Alstrom, DO   Next MD Visit none scheduled     Precautions   Precautions None     Balance Screen   Has the patient fallen in the past 6 months No     [place] residence     Prior Function   Level of Independence Independent     ROM / Strength   AROM / PROM / Strength AROM;Strength;PROM     AROM   Overall AROM Comments L: mild limitation for ER     PROM   Overall PROM Comments L: mild limitation for ER     Strength   Overall Strength Comments R: WFL // L: limited by pain   Strength Assessment Site Hip   Right/Left Hip Left   Left Hip Flexion 4-/5   Left Hip Extension 4-/5   Left Hip External Rotation 4-/5   Left Hip Internal Rotation 4-/5   Left Hip ABduction 4-/5     Flexibility   Soft Tissue Assessment /Muscle Length yes   Hamstrings tight bil   Piriformis tight/sore on L     Palpation   Palpation comment Painful Greater trochanter, TFL, glut med and piriformis.  Mild joint restrictions for ER, mild pain into L SI.      Special Tests    Special Tests --  Short/Long test + on L, may indicate post rot on L.             Objective measurements completed on examination:  See above findings.          [facility] Adult PT Treatment/Exercise - 02/08/17 1700      Exercises   Exercises Knee/Hip     Knee/Hip Exercises: Stretches   Hip Flexor Stretch Left;3 reps;30 seconds   Hip Flexor Stretch Limitations Standing/Runners stretch   ITB Stretch Left;3 reps;30 seconds   ITB Stretch Limitations Supine with strap   Piriformis Stretch Left;3 reps;30 seconds   Piriformis Stretch Limitations Seated and supine fig 4      Manual Therapy   Manual Therapy Joint mobilization;Soft tissue mobilization;Passive ROM;Muscle Energy Technique   Joint Mobilization Posterior hip mobs, grade 3    Soft tissue mobilization Tennis ball to TFL   Muscle Energy Technique to increase anterior rotation of L pelvis                PT Education - 02/08/17 1638    Education provided Yes   Education Details HEP, rest, education on Dx.    Person(s) Educated Patient   Methods Explanation;Handout   Comprehension Verbalized understanding;Need further  instruction          PT Short Term Goals - 02/08/17 1646      PT SHORT TERM GOAL #1   Title Pt to demo decreased pain in L hip, to 3/10    Time 2   Period Weeks   Status New   Target Date 02/22/17     PT SHORT TERM GOAL #2   Title Pt to be independent with initial HEP   Time 2   Period Weeks   Status New   Target Date 02/22/17           PT Long Term Goals - 02/08/17 1647      PT LONG TERM GOAL #1   Title Pt to report decreased pain in L hip, to 0-2/10 with activity and sleep   Time 8   Period Weeks   Status New   Target Date 04/05/17     PT LONG TERM GOAL #2   Title Pt to demo increased strength of L hip, to be at least 4+/5, to improve stability, and ability for ambulation   Time 8   Period Weeks   Status New   Target Date 04/05/17     PT LONG TERM GOAL #3   Title Pt to demo ability for ambulation, up to 1 mile, without increased pain greater than 2/10   Time 8   Period Weeks   Status New   Target Date 04/05/17                Plan - 02/08/17 1641    Clinical Impression Statement Pt presents with primary complaint of increased pain in L hip. She has soreness to palpate greater trochanter, TFL, groin, and glute. She has increased pain with passive IR, and soreness with resisted flexion. Pt with multiple sites of trigger points and soreness in surrounding musculature. She has mild ROM loss for ER compared to R side. Pt wtih decreased ability and endurance for ambulation and functional activities, due to pain. Pt to benefit from skilled care, to improve deficits, decrease pain, and return to functional activities and work duties without pain.    Clinical Presentation Stable   Clinical Decision Making Low   Rehab Potential Good   PT Frequency 2x / week   PT Duration 8 weeks   PT Treatment/Interventions ADLs/Self Care Home Management;Cryotherapy;Electrical Stimulation;Moist Heat;Ultrasound;Neuromuscular re-education;Balance training;Therapeutic  exercise;Therapeutic activities;Functional mobility training;Gait training;Patient/family education;Manual  techniques;Passive range of motion;Dry needling;Taping   PT Next Visit Plan Review HEP, progress stretches, add strengthening as pain decreases, Manual/hip mobs   PT Home Exercise Plan Piriformis stretch seated and supine; ITB with strap; MFR with tennis ball; Standing hip flexor stretch   Consulted and Agree with Plan of Care Patient      Patient will benefit from skilled therapeutic intervention in order to improve the following deficits and impairments:  Abnormal gait, Decreased activity tolerance, Decreased strength, Pain, Difficulty walking, Decreased range of motion, Impaired flexibility  Visit Diagnosis: Pain in left hip - Plan: PT plan of care cert/re-cert     Problem List Patient Active Problem List   Diagnosis Date Noted  . Left hip pain 02/05/2017  . 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 SINUSITIS, ACUTE 05/10/2009  . DYSFUNCTION OF EUSTACHIAN TUBE 12/09/2008  . LATERAL EPICONDYLITIS, LEFT 06/22/2008  . WEAKNESS, MUSCLE 10/19/2006  . DRY SKIN 10/19/2006  . CONTUSION, TOE 10/17/2006  . SHOULDER PAIN, RIGHT 09/21/2006  . NECK PAIN, CHRONIC 09/21/2006   Lyndee Hensen, PT, DPT 10:21 AM  02/09/17    [facility] [address] [place], [place], [address] Phone: [phone]   Fax:  [phone]  Name: Chantrell Apsey MRN: [id number] Date of Birth: [birth year]

## 2017-02-14 ENCOUNTER — Ambulatory Visit: Payer: BC Managed Care – PPO | Admitting: Family Medicine

## 2017-02-20 ENCOUNTER — Encounter: Payer: Self-pay | Admitting: Physical Therapy

## 2017-02-20 ENCOUNTER — Ambulatory Visit: Payer: BC Managed Care – PPO | Attending: Sports Medicine | Admitting: Physical Therapy

## 2017-02-20 DIAGNOSIS — M25552 Pain in left hip: Secondary | ICD-10-CM | POA: Diagnosis not present

## 2017-02-20 NOTE — Therapy (Signed)
[place], [place], [address] Phone: [phone]   Fax:  [phone]  Physical Therapy Treatment  Patient Details  Name: Natalie Carpenter MRN: [id number] Date of Birth: 1960/07/15 Referring Provider: Everlean Alstrom, DO  Encounter Date: 02/20/2017      PT End of Session - 02/20/17 1419    Visit Number 2   Number of Visits 16   Date for PT Re-Evaluation 04/05/17   PT Start Time 8546   PT Stop Time 1513   PT Time Calculation (min) 54 min   Activity Tolerance Patient tolerated treatment well   Behavior During Therapy [facility] for tasks assessed/performed      History reviewed. No pertinent past medical history.  Past Surgical History:  Procedure Laterality Date  . HERNIA REPAIR  2703   umbilical hernia    There were no vitals filed for this visit.      Subjective Assessment - 02/20/17 1420    Subjective thinks stretching back is helping. figure 4 is painful on L. L groin was really sore after eval.    Patient Stated Goals Decreased pain, sleep without pain.    Currently in Pain? Yes   Pain Score 3    Pain Location Groin   Pain Orientation Left   Pain Descriptors / Indicators Sore                         [facility] Adult PT Treatment/Exercise - 02/20/17 0001      Knee/Hip Exercises: Stretches   Hip Flexor Stretch Limitations runner stretch & thomas test position     Knee/Hip Exercises: Standing   Other Standing Knee Exercises standing abdominal engagement and resting posture     Knee/Hip Exercises: Seated   Other Seated Knee/Hip Exercises seated abdominal engagement     Knee/Hip Exercises: Supine   Other Supine Knee/Hip Exercises posterior pelvic tilt/abdominal engagement     Modalities   Modalities Moist Heat     Moist Heat Therapy   Number Minutes Moist Heat 10 Minutes   Moist Heat Location Hip     Manual Therapy   Soft tissue mobilization L illiopsoas   Muscle Energy  Technique L hip flexors/R extensors to increase L innom ant rot                PT Education - 02/20/17 1513    Education provided Yes   Education Details anatomy of condition, POC, exercise form/rationale, seated/standing posture   Person(s) Educated Patient   Methods Explanation;Demonstration;Tactile cues;Verbal cues   Comprehension Verbalized understanding;Returned demonstration;Verbal cues required;Tactile cues required;Need further instruction          PT Short Term Goals - 02/08/17 1646      PT SHORT TERM GOAL #1   Title Pt to demo decreased pain in L hip, to 3/10    Time 2   Period Weeks   Status New   Target Date 02/22/17     PT SHORT TERM GOAL #2   Title Pt to be independent with initial HEP   Time 2   Period Weeks   Status New   Target Date 02/22/17           PT Long Term Goals - 02/08/17 1647      PT LONG TERM GOAL #1   Title Pt to report decreased pain in L hip, to 0-2/10 with activity and sleep   Time 8   Period Weeks   Status  New   Target Date 04/05/17     PT LONG TERM GOAL #2   Title Pt to demo increased strength of L hip, to be at least 4+/5, to improve stability, and ability for ambulation   Time 8   Period Weeks   Status New   Target Date 04/05/17     PT LONG TERM GOAL #3   Title Pt to demo ability for ambulation, up to 1 mile, without increased pain greater than 2/10   Time 8   Period Weeks   Status New   Target Date 04/05/17               Plan - 02/20/17 1509    Clinical Impression Statement Post L innom rotation with excessive tightnening of L illiopsoas. Pt tends to sit with legs crossed and on R side with leg flexed/adducted/IR while sleeping which increases the spasm. Utilized MET to correct pelvic rotation and manual therapy to decrease trigger points. Pt denied use of TPDN today. Educated on engagement of abdominal wall in supine, seated and standing.    PT Treatment/Interventions ADLs/Self Care Home  Management;Cryotherapy;Electrical Stimulation;Moist Heat;Ultrasound;Neuromuscular re-education;Balance training;Therapeutic exercise;Therapeutic activities;Functional mobility training;Gait training;Patient/family education;Manual techniques;Passive range of motion;Dry needling;Taping   PT Next Visit Plan lumbopelvic stability, manual to illiopsoas   PT Home Exercise Plan Piriformis stretch seated and supine; ITB with strap; MFR with tennis ball; Standing hip flexor stretch & thomas test position; abdominal engagement; sit in neutral   Consulted and Agree with Plan of Care Patient      Patient will benefit from skilled therapeutic intervention in order to improve the following deficits and impairments:  Abnormal gait, Decreased activity tolerance, Decreased strength, Pain, Difficulty walking, Decreased range of motion, Impaired flexibility  Visit Diagnosis: Pain in left hip     Problem List Patient Active Problem List   Diagnosis Date Noted  . Left hip pain 02/05/2017  . 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 SINUSITIS, ACUTE 05/10/2009  . DYSFUNCTION OF EUSTACHIAN TUBE 12/09/2008  . LATERAL EPICONDYLITIS, LEFT 06/22/2008  . WEAKNESS, MUSCLE 10/19/2006  . DRY SKIN 10/19/2006  . CONTUSION, TOE 10/17/2006  . SHOULDER PAIN, RIGHT 09/21/2006  . NECK PAIN, CHRONIC 09/21/2006    Natalie Carpenter PT, DPT 02/20/17 3:18 PM   [facility] [address] [place], [place], [address] Phone: [phone]   Fax:  [phone]  Name: Natalie Carpenter MRN: [id number] Date of Birth: 10-29-1960

## 2017-02-26 ENCOUNTER — Encounter: Payer: Self-pay | Admitting: Physical Therapy

## 2017-02-26 ENCOUNTER — Ambulatory Visit: Payer: BC Managed Care – PPO | Admitting: Physical Therapy

## 2017-02-26 DIAGNOSIS — M25552 Pain in left hip: Secondary | ICD-10-CM | POA: Diagnosis not present

## 2017-02-26 NOTE — Therapy (Signed)
[place], [place], [address] Phone: [phone]   Fax:  [phone]  Physical Therapy Treatment  Patient Details  Name: Natalie Carpenter MRN: [id number] Date of Birth: 1960/11/18 Referring Provider: Everlean Alstrom, DO  Encounter Date: 02/26/2017      PT End of Session - 02/26/17 1329    Visit Number 3   Number of Visits 16   Date for PT Re-Evaluation 04/05/17   PT Start Time 1332   PT Stop Time 1417   PT Time Calculation (min) 45 min   Activity Tolerance Patient tolerated treatment well   Behavior During Therapy [facility] for tasks assessed/performed      History reviewed. No pertinent past medical history.  Past Surgical History:  Procedure Laterality Date  . HERNIA REPAIR  5176   umbilical hernia    There were no vitals filed for this visit.      Subjective Assessment - 02/26/17 1332    Subjective reports feeling the same. stretching as much as she can. Not taking medications. Wakes every morning at 6am needing to massage leg. Has been trying to change sleeping position with pillows.    Patient Stated Goals Decreased pain, sleep without pain.    Currently in Pain? Yes   Pain Score 4    Pain Location Hip   Pain Orientation Left   Pain Descriptors / Indicators Sore                         [facility] Adult PT Treatment/Exercise - 02/26/17 0001      Knee/Hip Exercises: Stretches   Passive Hamstring Stretch Limitations hamstring stretch with strap   Hip Flexor Stretch Limitations thomas test position     Modalities   Modalities Ultrasound     Ultrasound   Ultrasound Location L hip   Ultrasound Parameters cont 1.0 w/cm2   Ultrasound Goals Pain     Manual Therapy   Manual Therapy Manual Traction   Manual therapy comments educated on use of tennis ball for trigger point release   Soft tissue mobilization IASTM L glut med/min, TFL, piriformis   Manual Traction long axis L hip                 PT Education - 02/26/17 1417    Education provided Yes   Education Details anatomy of condition, trigger points, ultrasound, discussed TPDN   Person(s) Educated Patient   Methods Explanation;Demonstration;Tactile cues;Verbal cues;Handout   Comprehension Verbalized understanding;Returned demonstration;Verbal cues required;Tactile cues required;Need further instruction          PT Short Term Goals - 02/08/17 1646      PT SHORT TERM GOAL #1   Title Pt to demo decreased pain in L hip, to 3/10    Time 2   Period Weeks   Status New   Target Date 02/22/17     PT SHORT TERM GOAL #2   Title Pt to be independent with initial HEP   Time 2   Period Weeks   Status New   Target Date 02/22/17           PT Long Term Goals - 02/08/17 1647      PT LONG TERM GOAL #1   Title Pt to report decreased pain in L hip, to 0-2/10 with activity and sleep   Time 8   Period Weeks   Status New   Target Date 04/05/17     PT LONG TERM  GOAL #2   Title Pt to demo increased strength of L hip, to be at least 4+/5, to improve stability, and ability for ambulation   Time 8   Period Weeks   Status New   Target Date 04/05/17     PT LONG TERM GOAL #3   Title Pt to demo ability for ambulation, up to 1 mile, without increased pain greater than 2/10   Time 8   Period Weeks   Status New   Target Date 04/05/17               Plan - 02/26/17 1406    Clinical Impression Statement Reported feeling better after ultrasound today to L piriformis & glut med. concordant pain found in trigger point in piriformis that "radiated" around hip. Pt denied use of TPDN.    PT Treatment/Interventions ADLs/Self Care Home Management;Cryotherapy;Electrical Stimulation;Moist Heat;Ultrasound;Neuromuscular re-education;Balance training;Therapeutic exercise;Therapeutic activities;Functional mobility training;Gait training;Patient/family education;Manual techniques;Passive range of motion;Dry  needling;Taping   PT Next Visit Plan lumbopelvic stability, ultrasound helpful?   PT Home Exercise Plan Piriformis stretch seated and supine; ITB with strap; MFR with tennis ball; Standing hip flexor stretch & thomas test position; abdominal engagement; sit in neutral; supine HSS with strap   Consulted and Agree with Plan of Care Patient      Patient will benefit from skilled therapeutic intervention in order to improve the following deficits and impairments:  Abnormal gait, Decreased activity tolerance, Decreased strength, Pain, Difficulty walking, Decreased range of motion, Impaired flexibility  Visit Diagnosis: Pain in left hip     Problem List Patient Active Problem List   Diagnosis Date Noted  . Left hip pain 02/05/2017  . 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 SINUSITIS, ACUTE 05/10/2009  . DYSFUNCTION OF EUSTACHIAN TUBE 12/09/2008  . LATERAL EPICONDYLITIS, LEFT 06/22/2008  . WEAKNESS, MUSCLE 10/19/2006  . DRY SKIN 10/19/2006  . CONTUSION, TOE 10/17/2006  . SHOULDER PAIN, RIGHT 09/21/2006  . NECK PAIN, CHRONIC 09/21/2006   Inette Doubrava C. Kian Ottaviano PT, DPT 02/26/17 3:01 PM   [facility] [address] [place], [place], [address] Phone: [phone]   Fax:  [phone]  Name: Natalie Carpenter MRN: [id number] Date of Birth: Dec 04, 1960

## 2017-03-01 ENCOUNTER — Encounter: Payer: Self-pay | Admitting: Physical Therapy

## 2017-03-01 ENCOUNTER — Ambulatory Visit: Payer: BC Managed Care – PPO | Admitting: Physical Therapy

## 2017-03-01 ENCOUNTER — Encounter: Payer: Self-pay | Admitting: Family Medicine

## 2017-03-01 DIAGNOSIS — M25552 Pain in left hip: Secondary | ICD-10-CM | POA: Diagnosis not present

## 2017-03-01 NOTE — Therapy (Addendum)
[place], [place], [address] Phone: [phone]   Fax:  [phone]  Physical Therapy Treatment/Discharge Summary  Patient Details  Name: Natalie Carpenter MRN: [id number] Date of Birth: 06/25/1960 Referring Provider: Everlean Alstrom, DO  Encounter Date: 03/01/2017      PT End of Session - 03/01/17 1417    Visit Number 4   Number of Visits 16   Date for PT Re-Evaluation 04/05/17   PT Start Time 0240   PT Stop Time 1505   PT Time Calculation (min) 48 min   Activity Tolerance Patient tolerated treatment well   Behavior During Therapy [facility] for tasks assessed/performed      History reviewed. No pertinent past medical history.  Past Surgical History:  Procedure Laterality Date  . HERNIA REPAIR  9735   umbilical hernia    There were no vitals filed for this visit.      Subjective Assessment - 03/01/17 1417    Subjective L calf tightened up in car on the way over here today. Put heat on hip this morning. Has not had time to stretch. Reports hip felt released afterward but pain returned in the morning. Pt feels that pain came right back after sleeping.    Patient Stated Goals Decreased pain, sleep without pain.    Currently in Pain? Yes   Pain Score 3    Pain Location Hip   Pain Orientation Left   Pain Descriptors / Indicators Sore                         [facility] Adult PT Treatment/Exercise - 03/01/17 0001      Knee/Hip Exercises: Standing   SLS for activation of glut med     Knee/Hip Exercises: Sidelying   Hip ABduction Limitations with leg internally rotated   Hip ADduction 20 reps   Other Sidelying Knee/Hip Exercises hooklying ball squeeze 5s holds     Ultrasound   Ultrasound Location L hip   Ultrasound Parameters pulsed 1.0w/cm2   Ultrasound Goals Pain     Manual Therapy   Joint Mobilization prone PA at end range hip ER   Soft tissue mobilization L piriformis   Manual  Traction LLE long axis distraction                PT Education - 03/01/17 1645    Education provided Yes   Education Details importance of glut med activation, exercise form/rationale   Person(s) Educated Patient   Methods Explanation;Demonstration;Tactile cues;Verbal cues   Comprehension Verbalized understanding;Returned demonstration;Verbal cues required;Tactile cues required;Need further instruction          PT Short Term Goals - 02/08/17 1646      PT SHORT TERM GOAL #1   Title Pt to demo decreased pain in L hip, to 3/10    Time 2   Period Weeks   Status New   Target Date 02/22/17     PT SHORT TERM GOAL #2   Title Pt to be independent with initial HEP   Time 2   Period Weeks   Status New   Target Date 02/22/17           PT Long Term Goals - 02/08/17 1647      PT LONG TERM GOAL #1   Title Pt to report decreased pain in L hip, to 0-2/10 with activity and sleep   Time 8   Period Weeks   Status New  Target Date 04/05/17     PT LONG TERM GOAL #2   Title Pt to demo increased strength of L hip, to be at least 4+/5, to improve stability, and ability for ambulation   Time 8   Period Weeks   Status New   Target Date 04/05/17     PT LONG TERM GOAL #3   Title Pt to demo ability for ambulation, up to 1 mile, without increased pain greater than 2/10   Time 8   Period Weeks   Status New   Target Date 04/05/17               Plan - 03/01/17 1506    Clinical Impression Statement Pt with significant difficulty activating glut med today placing excessive strain on TFL creating anterior hip pain. Pt improves with each visit but is lacking long term carryover. Will continue to benefit from skilled PT in order to improve appropraite biomechanical chain activation through hips and lower extremities.    PT Treatment/Interventions ADLs/Self Care Home Management;Cryotherapy;Electrical Stimulation;Moist Heat;Ultrasound;Neuromuscular re-education;Balance  training;Therapeutic exercise;Therapeutic activities;Functional mobility training;Gait training;Patient/family education;Manual techniques;Passive range of motion;Dry needling;Taping   PT Next Visit Plan L glut med activation   PT Home Exercise Plan Piriformis stretch seated and supine; ITB with strap; MFR with tennis ball; Standing hip flexor stretch & thomas test position; abdominal engagement; sit in neutral; supine HSS with strap; SLS with level pelvis   Consulted and Agree with Plan of Care Patient      Patient will benefit from skilled therapeutic intervention in order to improve the following deficits and impairments:  Abnormal gait, Decreased activity tolerance, Decreased strength, Pain, Difficulty walking, Decreased range of motion, Impaired flexibility  Visit Diagnosis: Pain in left hip     Problem List Patient Active Problem List   Diagnosis Date Noted  . Left hip pain 02/05/2017  . 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 SINUSITIS, ACUTE 05/10/2009  . DYSFUNCTION OF EUSTACHIAN TUBE 12/09/2008  . LATERAL EPICONDYLITIS, LEFT 06/22/2008  . WEAKNESS, MUSCLE 10/19/2006  . DRY SKIN 10/19/2006  . CONTUSION, TOE 10/17/2006  . SHOULDER PAIN, RIGHT 09/21/2006  . NECK PAIN, CHRONIC 09/21/2006   Azaylia Fong C. Percival Glasheen PT, DPT 03/01/17 4:46 PM   [facility] [address] [place], [place], [address] Phone: [phone]   Fax:  [phone]  Name: Allesha Aronoff MRN: [id number] Date of Birth: [birth year]  PHYSICAL THERAPY DISCHARGE SUMMARY  Visits from Start of Care: 4  Current functional level related to goals / functional outcomes: See above   Remaining deficits: See above   Education / Equipment: Anatomy of condition, POC, HEP, exercise form/rationale  Plan: Patient agrees to discharge.  Patient goals were not met. Patient is being discharged due to lack of progress.  ?????     Ronnald Shedden  C. Kaseem Vastine PT, DPT 03/05/17 3:32 PM

## 2017-03-05 ENCOUNTER — Ambulatory Visit (INDEPENDENT_AMBULATORY_CARE_PROVIDER_SITE_OTHER): Payer: BC Managed Care – PPO | Admitting: Sports Medicine

## 2017-03-05 VITALS — BP 98/70 | Ht 66.5 in | Wt 130.0 lb

## 2017-03-05 DIAGNOSIS — M25552 Pain in left hip: Secondary | ICD-10-CM | POA: Diagnosis not present

## 2017-03-05 NOTE — Progress Notes (Signed)
   Subjective:    Patient ID: Natalie Carpenter, female    DOB: 22-Jun-1960, 56 y.o.   MRN: [id number]  HPI 56 yo white Natalie Carpenter presents today to follow-up for L hip pain. At the last visit she was prescribed Mobic and sent to PT for strengthening and stretching exercises. She states that the Mobic worked for 3-days. Once she began PT she reports no change in pain. She reports being unable to sleep on that side secondary to pain. She is able to walk and play tennis with minimal achy/nagging 4/10 pain. The pain is worsening by prolonged sitting on hard surfaces. She has tried ice and Tylenol, in addition to the Mobic, which has provided temporarily relief. She is interested in relief of these symptoms so that she can get back to playing tennis regularly.   Review of Systems Musculoskeletal: No joint swelling, redness, rashes, bruising Neurological:  Achy dull deep hip pain, no radiation down the leg    Objective:   Physical Exam  Left Hip: Inspection: C-sign over L-hip, normal gait, no valgus or varus abnormalities noted, does not favor a side with walking or standing Palpation: Pain on deep palpation at the hip flexor region and deep to medial gluteus/piriformis/SI joint, no pain on palpation of greater trochanter  ROM: Pain with internal and external rotation of the hip and resisted abduction, good ROM on hip flexion and knee extension Strength: IR: 5/5, ER: 5/5, Flexion: 5/5, Extension: 5/5, Abduction: 5/5, Adduction: 5/5, Weakness on gluteus medius standing hip lifts bilaterally Special test: FABER/FADER negative     Assessment & Plan:  Persistent left hip pain-question degenerative labral tear  I had a long talk with the patient regarding her ongoing left hip pain. Her exam suggests a possible degenerative labral tear. Previous x-rays showed only some mild OA. I discussed further diagnostic imaging but she would like to hold on that for now. There is no doubt that she has hip weakness so  we will give her a comprehensive hip strengthening program to do at home. She will follow-up with me again in 4 weeks for reevaluation. If her symptoms persist we may need to reconsider merits of an MRI specifically to rule out a degenerative labral tear in the hip. However, she is not currently interested in injections or surgery. She is encouraged to call me with questions or concerns prior to her follow-up visit.

## 2017-03-08 ENCOUNTER — Ambulatory Visit: Payer: BC Managed Care – PPO | Admitting: Physical Therapy

## 2017-03-17 ENCOUNTER — Encounter: Payer: Self-pay | Admitting: Family Medicine

## 2017-03-19 ENCOUNTER — Other Ambulatory Visit: Payer: Self-pay

## 2017-03-19 MED ORDER — PREDNISONE 10 MG (21) PO TBPK: ORAL_TABLET | ORAL | 0 refills | Status: DC

## 2017-03-19 MED ORDER — CYCLOBENZAPRINE HCL 10 MG PO TABS: ORAL_TABLET | ORAL | 0 refills | Status: DC

## 2017-03-22 ENCOUNTER — Other Ambulatory Visit: Payer: Self-pay

## 2017-03-22 DIAGNOSIS — M25552 Pain in left hip: Secondary | ICD-10-CM

## 2017-04-02 ENCOUNTER — Ambulatory Visit: Payer: BC Managed Care – PPO | Admitting: Sports Medicine

## 2017-04-05 ENCOUNTER — Ambulatory Visit
Admission: RE | Admit: 2017-04-05 | Discharge: 2017-04-05 | Disposition: A | Discharge status: Home / Self Care | Payer: BC Managed Care – PPO | Source: Ambulatory Visit | Attending: Sports Medicine | Admitting: Sports Medicine

## 2017-04-05 DIAGNOSIS — M25552 Pain in left hip: Secondary | ICD-10-CM

## 2017-04-09 ENCOUNTER — Ambulatory Visit (INDEPENDENT_AMBULATORY_CARE_PROVIDER_SITE_OTHER): Payer: BC Managed Care – PPO | Admitting: Sports Medicine

## 2017-04-09 ENCOUNTER — Encounter: Payer: Self-pay | Admitting: Sports Medicine

## 2017-04-09 VITALS — BP 111/61 | Ht 66.0 in | Wt 135.0 lb

## 2017-04-09 DIAGNOSIS — M25572 Pain in left ankle and joints of left foot: Secondary | ICD-10-CM

## 2017-04-09 DIAGNOSIS — M25552 Pain in left hip: Secondary | ICD-10-CM | POA: Diagnosis not present

## 2017-04-09 MED ORDER — DICLOFENAC SODIUM 75 MG PO TBEC: 75.0000 mg | DELAYED_RELEASE_TABLET | Freq: Two times a day (BID) | ORAL | 0 refills | Status: DC

## 2017-04-10 NOTE — Progress Notes (Signed)
   Subjective:    Patient ID: Natalie Carpenter, female    DOB: 02/02/1961, 56 y.o.   MRN: [id number]  HPI   Patient comes in today to discuss MRI findings of her left hip. Minimal degenerative changes are present. 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 Her main complaint today is left heel pain that began suddenly without any trauma. She is concerned that it may be sciatica. She denies pain proximal to the heel. No numbness or tingling. She's not noticed any swelling.   Review of Systems As above    Objective:   Physical Exam  Well-developed, well-nourished. No acute distress  Left heel: She is tender to palpation at the calcaneal origin of the plantar fascia. Negative calcaneal squeeze. No soft tissue swelling. Good pulses. Neurovascularly intact distally.  Neurological exam: Negative straight leg raise bilaterally. Strength is 5/5 in both lower extremity. Reflexes are brisk and equal at the Achilles and patellar tendons bilaterally. Sensation is intact to light touch grossly.      Assessment & Plan:   Left heel pain likely secondary to early plantar fasciitis Left hip pain secondary to tendinosis  I reassured the patient that I do not think her heel pain secondary to sciatica. For both her heel and her hip I've placed her on Voltaren 75 mg twice daily with food for the next several days. She will discontinue her meloxicam. I did provide her with a 6 day Sterapred Dosepak at the time of her last office visit. She has not yet used that but she will start that in 3 or 4 days if she gets no relief with the Voltaren. She is instructed in plantar fascial stretches and will start daily icing. In regards to her left hip I reassured her that she has no significant degenerative changes. She definitely has hip weakness and has been well educated in a home exercise program. I've reiterated the importance of her doing those exercises. I think she can continue with  activity as tolerated using pain in her guide and follow-up with me in a month.

## 2017-04-24 ENCOUNTER — Telehealth: Payer: Self-pay | Admitting: Family Medicine

## 2017-04-24 ENCOUNTER — Encounter: Payer: Self-pay | Admitting: Family Medicine

## 2017-04-24 ENCOUNTER — Encounter: Payer: Self-pay | Admitting: *Deleted

## 2017-04-24 NOTE — Telephone Encounter (Signed)
Pt declined to make an appt, stating she knows she has sinus infection and does not need to be seen. This has been going on 10 days Would like a call back asap

## 2017-04-24 NOTE — Telephone Encounter (Signed)
Patient states she has a sinus infection and would like antibiotics. Patient would like a call back.   Pt states pharmacy is [facility] located at on [address] [facility]

## 2017-04-24 NOTE — Telephone Encounter (Signed)
mychart message has been sent requesting patient to schedule an office visit.

## 2017-04-24 NOTE — Telephone Encounter (Signed)
Pt requesting for antibiotics to be called in to her pharmacy. Offered to make appointment. Pt refused. Offered e-visit and pt refused. Pt stated she was "frustrated" and "did not have time to come on for an appointment." Asking someone to call her regarding this.

## 2017-04-24 NOTE — Telephone Encounter (Signed)
Needs to be seen

## 2017-04-25 MED ORDER — DOXYCYCLINE HYCLATE 100 MG PO TABS: 100.0000 mg | ORAL_TABLET | Freq: Two times a day (BID) | ORAL | 0 refills | Status: DC

## 2017-04-25 NOTE — Telephone Encounter (Signed)
A reply to a mychart message has been sent to the patient.

## 2017-05-07 ENCOUNTER — Other Ambulatory Visit: Payer: Self-pay

## 2017-05-07 MED ORDER — DICLOFENAC SODIUM 75 MG PO TBEC: 75.0000 mg | DELAYED_RELEASE_TABLET | Freq: Two times a day (BID) | ORAL | 1 refills | Status: DC

## 2017-07-13 ENCOUNTER — Ambulatory Visit: Payer: BC Managed Care – PPO | Admitting: Family Medicine

## 2017-07-13 ENCOUNTER — Encounter: Payer: Self-pay | Admitting: Family Medicine

## 2017-07-13 VITALS — BP 100/70 | HR 69 | Temp 98.1°F | Ht 66.0 in | Wt 137.0 lb

## 2017-07-13 DIAGNOSIS — J209 Acute bronchitis, unspecified: Secondary | ICD-10-CM | POA: Diagnosis not present

## 2017-07-13 NOTE — Progress Notes (Signed)
Subjective:     Patient ID: Natalie Carpenter, female   DOB: 1961/03/21, 57 y.o.   MRN: [id number]  HPI Patient seen with 2 day history of cough. More than anything she's had some chest congestion. No fever. She states her ears feel "full. Occasional postnasal drip. She took one dose of Mucinex earlier today. No wheezing. No dyspnea. No sinusitis symptoms  History reviewed. No pertinent past medical history. Past Surgical History:  Procedure Laterality Date  . HERNIA REPAIR  5284   umbilical hernia    reports that  has never smoked. she has never used smokeless tobacco. She reports that she drinks alcohol. She reports that she does not use drugs. family history includes Cancer in her father; Sarcoidosis in her sister. Allergies  Allergen Reactions  . Sulfonamide Derivatives     REACTION: rash     Review of Systems  Constitutional: Negative for chills and fever.  HENT: Positive for postnasal drip.   Respiratory: Positive for cough.        Objective:   Physical Exam  Constitutional: She appears well-developed and well-nourished.  HENT:  Right Ear: External ear normal.  Left Ear: External ear normal.  Mouth/Throat: Oropharynx is clear and moist.  Neck: Neck supple.  Cardiovascular: Normal rate and regular rhythm.  Pulmonary/Chest: Effort normal and breath sounds normal. No respiratory distress. She has no wheezes. She has no rales.  Lymphadenopathy:    She has no cervical adenopathy.       Assessment:     Probable acute bronchitis. Nonfocal exam. No respiratory distress    Plan:     -Continue Mucinex over-the-counter -Follow-up promptly for any fever or increased shortness of breath  Eulas Post MD [facility]

## 2017-07-13 NOTE — Patient Instructions (Signed)

## 2017-07-30 ENCOUNTER — Ambulatory Visit: Payer: BC Managed Care – PPO | Admitting: Sports Medicine

## 2017-07-30 VITALS — BP 103/75 | Ht 66.75 in | Wt 135.0 lb

## 2017-07-30 DIAGNOSIS — M25552 Pain in left hip: Secondary | ICD-10-CM

## 2017-07-30 NOTE — Progress Notes (Signed)
   Subjective:    Patient ID: Natalie Carpenter, female    DOB: 09/22/1960, 57 y.o.   MRN: [id number]  HPI [name] is a 57 year old female who presents for follow up of left hip and left heel pain, which she was last seen for in October 2018. 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 At that time she was given a course of meloxicam and well as a steroid dose pack if she wanted to use it. She states that she did take the meloxicam with minimal improvement, however decided to not take her course of steroids. She admits that she was not consistent with her hip abductor and hamstring exercises, but has started swimming for the past 2 months and is feeling stronger overall. She has continued to have daily hip pain, and heel pain several days a week. She states that her hip pain wakes her up in the middle of the night, and she cannot lay on the left hip. Her pain is worst in the anterior hip/groin area, but she is also having lateral hip pain. She denies any weakness, numbness, or tingling down her leg. Denies any low back pain.   Review of Systems Negative, except as stated above    Objective:   Physical Exam General: No acute distress. Slightly anxious about continued symptoms.  Left hip: Diffuse mild tenderness to palpation. Full ROM, mild pain with internal rotation. Weakness of hip abductors, no other strength deficits. Negative log roll. Left foot: No swelling or erythema. No tenderness to palpation. No achilles tenderness. Positive calcaneal squeeze test.  L-spine: No midline or paraspinal tenderness. Full ROM. Negative stork test bilaterally. Negative straight let raise. Neuro: LE reflexes 2+ bilaterally. Sensation intact and equal bilaterally.       Assessment & Plan:  57 year old female presents for follow up of left hip and left heel pain. Patient is concerned that symptoms may  be coming from her back. Back and neuro exam was negative today. Hip exam is nonspecific, but most suggestive of intraarticular pathology. Patient has failed conservative therapy including PT, anti inflammatories, and home exercises. At this time I think the patient should be see by Dr. Ninfa Linden for an evaluation of the source of her pain, and potentially a diagnostic injection. If Dr. Ninfa Linden does not agree that her hip is the source of her pain, then may consider back imaging at that time.  Progress note scribed by Mali Norton MSIV. Patient seen and evaluated by me. I agree with the above plan of care.

## 2017-07-30 NOTE — Patient Instructions (Signed)
  Dr. Trevor Mace office will call you to schedule the appointment.

## 2017-08-01 ENCOUNTER — Ambulatory Visit (INDEPENDENT_AMBULATORY_CARE_PROVIDER_SITE_OTHER): Payer: BC Managed Care – PPO | Admitting: Orthopaedic Surgery

## 2017-08-01 ENCOUNTER — Encounter (INDEPENDENT_AMBULATORY_CARE_PROVIDER_SITE_OTHER): Payer: Self-pay | Admitting: Orthopaedic Surgery

## 2017-08-01 DIAGNOSIS — M7062 Trochanteric bursitis, left hip: Secondary | ICD-10-CM | POA: Insufficient documentation

## 2017-08-01 MED ORDER — LIDOCAINE HCL 1 % IJ SOLN
3.0000 mL | INTRAMUSCULAR | Status: AC | PRN
  Administered 2017-08-01: 3 mL

## 2017-08-01 MED ORDER — METHYLPREDNISOLONE ACETATE 40 MG/ML IJ SUSP
40.0000 mg | INTRAMUSCULAR | Status: AC | PRN
  Administered 2017-08-01: 40 mg via INTRA_ARTICULAR

## 2017-08-01 NOTE — Progress Notes (Signed)
Office Visit Note   Patient: Natalie Carpenter           Date of Birth: 11/21/1960           MRN: [id number] Visit Date: 08/01/2017              Requested by: Eulas Post, MD [place], [place] [address] PCP: Eulas Post, MD   Assessment & Plan: Visit Diagnoses:  1. Trochanteric bursitis, left hip     Plan: I feel that the patient does have severe trochanteric bursitis.  I offered an injection around this area and explained the risk benefits injections.  She tolerated this well.  I do feel that she would benefit from formal physical therapy which she is concerned about because her insurance does not cover a lot of this.  Of also recommended at least over-the-counter topical medications to try as well.  All questions concerns were answered and addressed.  We will see her back in about 4 weeks see how she is doing overall.  Follow-Up Instructions: Return in about 4 weeks (around 08/29/2017).   Orders:  Orders Placed This Encounter  Procedures  . Large Joint Inj   No orders of the defined types were placed in this encounter.     Procedures: Large Joint Inj: L greater trochanter on 08/01/2017 9:02 AM Indications: pain and diagnostic evaluation Details: 22 G 1.5 in needle, lateral approach  Arthrogram: No  Medications: 3 mL lidocaine 1 %; 40 mg methylPREDNISolone acetate 40 MG/ML Outcome: tolerated well, no immediate complications Procedure, treatment alternatives, risks and benefits explained, specific risks discussed. Consent was given by the patient. Immediately prior to procedure a time out was called to verify the correct patient, procedure, equipment, support staff and site/side marked as required. Patient was prepped and draped in the usual sterile fashion.       Clinical Data: No additional findings.   Subjective: Chief Complaint  Patient presents with  . Left Hip - Pain  Patient is a very pleasant and active thin 57 year old female  who comes in with debilitating left hip pain is been going on for over 6 months now.  She cannot lay on her sides at night and she is a side sleeper.  She is a first thing in the morning is always the worst.  There is a radiating component of her pain in terms of heel pain on the left side and low back pain but mainly is around her hip.  Is not so much in the groin area.  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  She is tried meloxicam and is not help.  Flexeril does help her sleep at night.  Advil is helped more.  She denies any specific injury.  She is very active as a said before.  She is not a diabetic.    HPI  Review of Systems She currently denies any headache chest pain, shortness of breath, fever, chills, nausea, vomiting.  She is alert and oriented x3 and in no acute distress.  Objective: Vital Signs: LMP 09/03/2010   Physical Exam She is alert and oriented x3 and in no acute distress. Ortho Exam Examination of her left hip shows fluid internal/external rotation with pain only over the trochanteric area on the extremes of rotation.  There is no pain in the groin at all.  When I had her lay on her side she has severe tenderness to palpation of the trochanteric area  to the point where she does push my hand away.  It does radiate down the iliotibial band.  Her knee foot and ankle exam are normal.  She has negative straight leg raise.  She has normal sensation and muscle tone and strength of her bilateral lower extremities. Specialty Comments:  No specialty comments available.  Imaging: No results found. Independent review of plain films and the MRI of her left hip shows insertional tendinitis of the hamstring tendon proximally on both sides as well as tendinosis of the trochanteric area where the iliotibial band inserts.  There is no frank tear.  The hip joints themselves appear normal.  PMFS History: Patient Active Problem List   Diagnosis Date Noted  . Trochanteric  bursitis, left hip 08/01/2017  . Left hip pain 02/05/2017  . 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 SINUSITIS, ACUTE 05/10/2009  . DYSFUNCTION OF EUSTACHIAN TUBE 12/09/2008  . LATERAL EPICONDYLITIS, LEFT 06/22/2008  . WEAKNESS, MUSCLE 10/19/2006  . DRY SKIN 10/19/2006  . CONTUSION, TOE 10/17/2006  . SHOULDER PAIN, RIGHT 09/21/2006  . NECK PAIN, CHRONIC 09/21/2006   History reviewed. No pertinent past medical history.  Family History  Problem Relation Age of Onset  . Cancer Father        prostate  . Sarcoidosis Sister     Past Surgical History:  Procedure Laterality Date  . HERNIA REPAIR  7412   umbilical hernia   Social History   Occupational History  . Not on file  Tobacco Use  . Smoking status: Never Smoker  . Smokeless tobacco: Never Used  Substance and Sexual Activity  . Alcohol use: Yes    Alcohol/week: 0.0 oz    Comment: occ  . Drug use: No  . Sexual activity: Not on file

## 2017-08-10 ENCOUNTER — Telehealth (INDEPENDENT_AMBULATORY_CARE_PROVIDER_SITE_OTHER): Payer: Self-pay | Admitting: Orthopaedic Surgery

## 2017-08-10 NOTE — Telephone Encounter (Signed)
I would still have her continue her PT.

## 2017-08-10 NOTE — Telephone Encounter (Signed)
Patient called stating that the injection did not work and wanted to know if she should continue her PT.  She has an appointment on Tuesday.  Please let the patient know on Monday.  It is okay to leave a message on her voicemail.  [phone].  Thank you.

## 2017-08-10 NOTE — Telephone Encounter (Signed)
Please advise 

## 2017-08-10 NOTE — Telephone Encounter (Signed)
Patient is aware of the below message

## 2017-09-05 ENCOUNTER — Ambulatory Visit (INDEPENDENT_AMBULATORY_CARE_PROVIDER_SITE_OTHER): Payer: BC Managed Care – PPO | Admitting: Orthopaedic Surgery

## 2017-10-15 ENCOUNTER — Telehealth: Payer: Self-pay | Admitting: Family Medicine

## 2017-10-15 NOTE — Telephone Encounter (Signed)
Copied from [place] [id number]. Topic: Quick Communication - See Telephone Encounter >> Oct 15, 2017  9:18 AM Synthia Innocent wrote: CRM for notification. See Telephone encounter for: 10/15/17. Would like to speak with CMA regarding injections for traveling out of country, due for tetanus?

## 2017-10-15 NOTE — Telephone Encounter (Signed)
Left message on machine TDAP is due.  CRM created

## 2017-10-15 NOTE — Telephone Encounter (Signed)
Patient is scheduled for CPE on 10/19/17 will get tetanus then, has other questions regarding other injections for leaving country, would like to speak to nurse. Measles titer?   j[place] encephalitis vaccine? Has a meeting at 4 requesting call back before then. Please advise

## 2017-10-16 NOTE — Telephone Encounter (Signed)
Left message on machine returning patient's call.  CRM created

## 2017-10-19 ENCOUNTER — Encounter: Payer: Self-pay | Admitting: Family Medicine

## 2017-10-19 ENCOUNTER — Ambulatory Visit (INDEPENDENT_AMBULATORY_CARE_PROVIDER_SITE_OTHER): Payer: BC Managed Care – PPO | Admitting: Family Medicine

## 2017-10-19 VITALS — BP 110/70 | HR 80 | Temp 97.9°F | Ht 66.5 in | Wt 133.3 lb

## 2017-10-19 DIAGNOSIS — Z23 Encounter for immunization: Secondary | ICD-10-CM

## 2017-10-19 DIAGNOSIS — Z0000 Encounter for general adult medical examination without abnormal findings: Secondary | ICD-10-CM

## 2017-10-19 MED ORDER — CYCLOBENZAPRINE HCL 5 MG PO TABS: ORAL_TABLET | ORAL | 1 refills | Status: DC

## 2017-10-19 NOTE — Progress Notes (Signed)
  Subjective:     Patient ID: Natalie Carpenter, female   DOB: 02/05/1961, 57 y.o.   MRN: [id number]  HPI Seen for physical exam. She sees gynecologist for Pap smears and mammograms. Generally very healthy. She's had some ongoing problems with pain in her left hip region. She had MRI scan which was unremarkable. She's had some recent occasional transient sensation of increased warmth involving her left lower leg and occasionally thigh. She wonders if this may be related to nerve impingement from her lumbar area. Denies any low back pain currently. No weakness. No urine or stool incontinence.  Needs tetanus booster. No history of shingles vaccine. Upcoming travel to [place] in July. She plans to go to health department for typhoid vaccine and plans to get malaria vaccine there as well. She thinks she's had one hepatitis A but is requesting that we check her immune status. She also has questions regarding measles immunity. She has no record of her vaccines.  No past medical history on file. Past Surgical History:  Procedure Laterality Date  . HERNIA REPAIR  2542   umbilical hernia    reports that she has never smoked. She has never used smokeless tobacco. She reports that she drinks alcohol. She reports that she does not use drugs. family history includes Cancer in her father; Sarcoidosis in her sister. Allergies  Allergen Reactions  . Sulfonamide Derivatives     REACTION: rash     Review of Systems  Constitutional: Negative for activity change, appetite change, fatigue, fever and unexpected weight change.  HENT: Negative for ear pain, hearing loss, sore throat and trouble swallowing.   Eyes: Negative for visual disturbance.  Respiratory: Negative for cough and shortness of breath.   Cardiovascular: Negative for chest pain and palpitations.  Gastrointestinal: Negative for abdominal pain, blood in stool, constipation and diarrhea.  Genitourinary: Negative for dysuria and hematuria.   Musculoskeletal: Negative for arthralgias, back pain and myalgias.  Skin: Negative for rash.  Neurological: Negative for dizziness, syncope and headaches.  Hematological: Negative for adenopathy.  Psychiatric/Behavioral: Negative for confusion and dysphoric mood.       Objective:   Physical Exam  Constitutional: She is oriented to person, place, and time. She appears well-developed and well-nourished.  HENT:  Head: Normocephalic and atraumatic.  Right Ear: External ear normal.  Left Ear: External ear normal.  Mouth/Throat: Oropharynx is clear and moist.  Eyes: Pupils are equal, round, and reactive to light.  Neck: Neck supple.  Cardiovascular: Normal rate and regular rhythm.  No murmur heard. Pulmonary/Chest: Effort normal and breath sounds normal. She has no wheezes. She has no rales.  Abdominal: Soft. Bowel sounds are normal. She exhibits no distension and no mass. There is no tenderness. There is no guarding.  Musculoskeletal: She exhibits no edema.  Lymphadenopathy:    She has no cervical adenopathy.  Neurological: She is alert and oriented to person, place, and time. No cranial nerve deficit.  Skin: No rash noted.       Assessment:     Physical exam. The following issues were addressed    Plan:     -Tetanus booster given with Tdap -Obtain screening lab work -Discussed new shingles vaccine and she will consider later -We decided to check roseola IgG she has questions regarding her immune status with measles prior to international travel. -Patient also requesting hepatitis A test of immunity  Eulas Post MD [facility]

## 2017-10-19 NOTE — Telephone Encounter (Signed)
Patient scheduled for an appointment today 10/19/17.

## 2017-10-20 ENCOUNTER — Encounter: Payer: Self-pay | Admitting: Family Medicine

## 2017-10-20 LAB — CBC WITH DIFFERENTIAL/PLATELET
BASOS ABS: 28 {cells}/uL (ref 0–200)
Basophils Relative: 0.4 %
EOS ABS: 128 {cells}/uL (ref 15–500)
Eosinophils Relative: 1.8 %
HCT: 35.3 % (ref 35.0–45.0)
Hemoglobin: 11.9 g/dL (ref 11.7–15.5)
Lymphs Abs: 1803 cells/uL (ref 850–3900)
MCH: 29.3 pg (ref 27.0–33.0)
MCHC: 33.7 g/dL (ref 32.0–36.0)
MCV: 86.9 fL (ref 80.0–100.0)
MPV: 11.7 fL (ref 7.5–12.5)
Monocytes Relative: 10.5 %
NEUTROS PCT: 61.9 %
Neutro Abs: 4395 cells/uL (ref 1500–7800)
PLATELETS: 281 10*3/uL (ref 140–400)
RBC: 4.06 10*6/uL (ref 3.80–5.10)
RDW: 12.6 % (ref 11.0–15.0)
TOTAL LYMPHOCYTE: 25.4 %
WBC: 7.1 10*3/uL (ref 3.8–10.8)
WBCMIX: 746 {cells}/uL (ref 200–950)

## 2017-10-20 LAB — LIPID PANEL
CHOLESTEROL: 217 mg/dL — AB (ref <200)
HDL: 65 mg/dL (ref >50)
LDL Cholesterol (Calc): 135 mg/dL (calc) — ABNORMAL HIGH
Non-HDL Cholesterol (Calc): 152 mg/dL (calc) — ABNORMAL HIGH (ref <130)
Total CHOL/HDL Ratio: 3.3 (calc) (ref <5.0)
Triglycerides: 76 mg/dL (ref <150)

## 2017-10-20 LAB — HEPATIC FUNCTION PANEL
AG Ratio: 2 (calc) (ref 1.0–2.5)
ALT: 9 U/L (ref 6–29)
AST: 13 U/L (ref 10–35)
Albumin: 4.5 g/dL (ref 3.6–5.1)
Alkaline phosphatase (APISO): 58 U/L (ref 33–130)
BILIRUBIN INDIRECT: 0.2 mg/dL (ref 0.2–1.2)
BILIRUBIN TOTAL: 0.3 mg/dL (ref 0.2–1.2)
Bilirubin, Direct: 0.1 mg/dL (ref 0.0–0.2)
Globulin: 2.2 g/dL (calc) (ref 1.9–3.7)
Total Protein: 6.7 g/dL (ref 6.1–8.1)

## 2017-10-20 LAB — BASIC METABOLIC PANEL
BUN: 16 mg/dL (ref 7–25)
CHLORIDE: 106 mmol/L (ref 98–110)
CO2: 28 mmol/L (ref 20–32)
CREATININE: 0.76 mg/dL (ref 0.50–1.05)
Calcium: 10.1 mg/dL (ref 8.6–10.4)
Glucose, Bld: 91 mg/dL (ref 65–99)
POTASSIUM: 4.9 mmol/L (ref 3.5–5.3)
SODIUM: 142 mmol/L (ref 135–146)

## 2017-10-20 LAB — TSH: TSH: 1.29 m[IU]/L (ref 0.40–4.50)

## 2017-10-21 ENCOUNTER — Encounter: Payer: Self-pay | Admitting: Family Medicine

## 2017-10-22 LAB — HEPATITIS A ANTIBODY, TOTAL: Hepatitis A AB,Total: REACTIVE — AB

## 2017-10-22 LAB — RUBEOLA ANTIBODY IGG: Rubeola IgG: <25 AU/mL — ABNORMAL LOW

## 2017-10-23 ENCOUNTER — Encounter: Payer: Self-pay | Admitting: Family Medicine

## 2017-10-25 IMAGING — MR MR HIP*L* W/O CM
5 series · 34 of 40 positions shown · non-contrast
Comparison: Radiographs 02/05/2017

CLINICAL DATA: Three-month history of left anterior hip pain. No
specific injury.

EXAM:
MR OF THE LEFT HIP WITHOUT CONTRAST
TECHNIQUE: Multiplanar, multisequence MR imaging was performed. No intravenous
contrast was administered.

[Series 9: STIR · coronal · left · 3.0mm · 1.25mm/px · 6 of 25 slices shown]
[im 1/25]
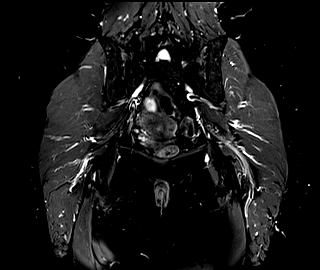
[im 5/25]
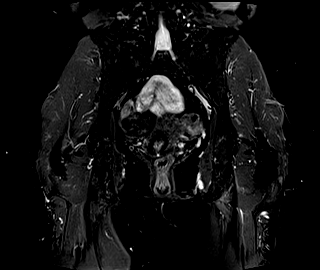
[im 10/25]
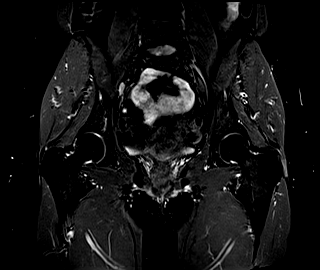
[im 15/25]
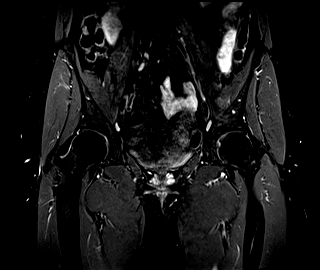
[im 20/25]
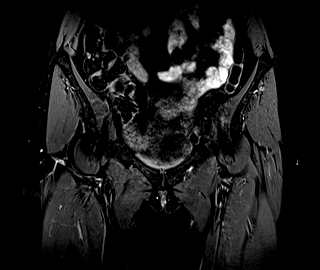
[im 25/25]
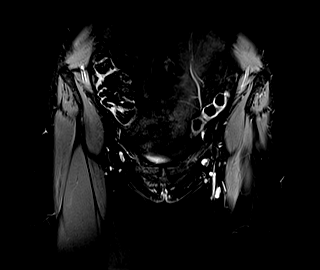

[Series 10: T1 · coronal · left · 3.0mm · 0.78mm/px · 9 of 30 slices shown (1 of 2)]
[im 1/30]
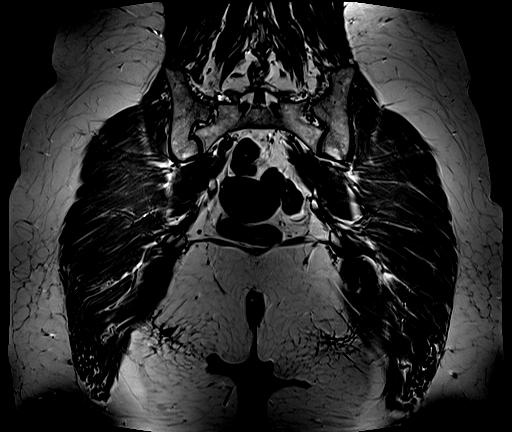
[im 4/30]
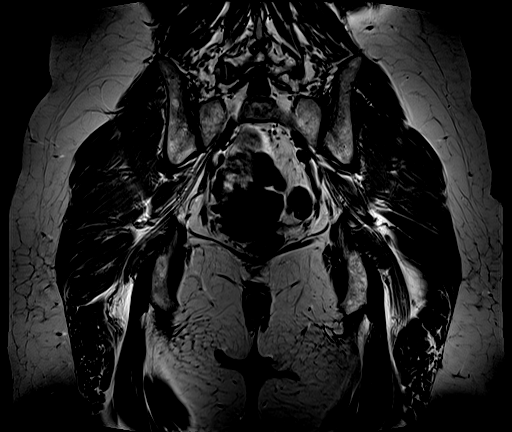
[im 8/30]
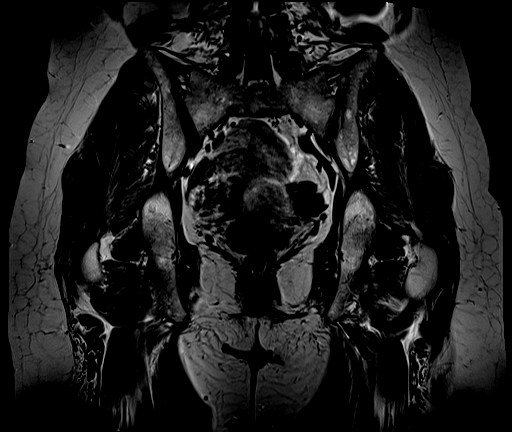
[im 11/30]
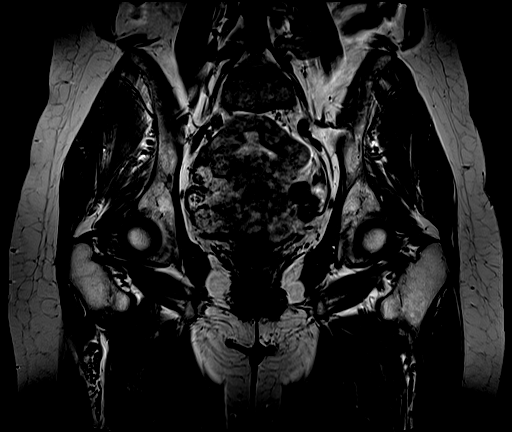
[im 15/30]
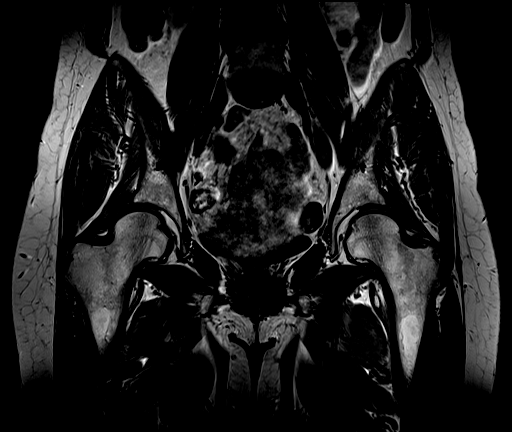
[im 19/30]
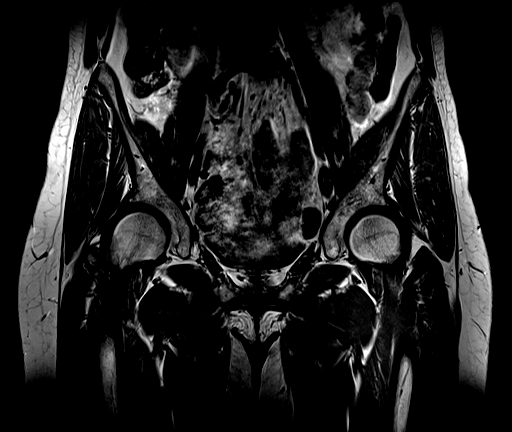
[im 22/30]
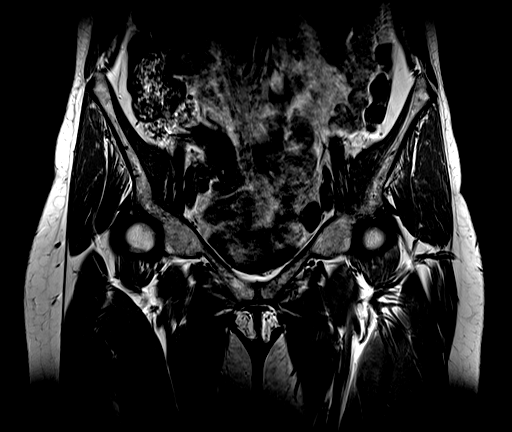
[im 26/30]
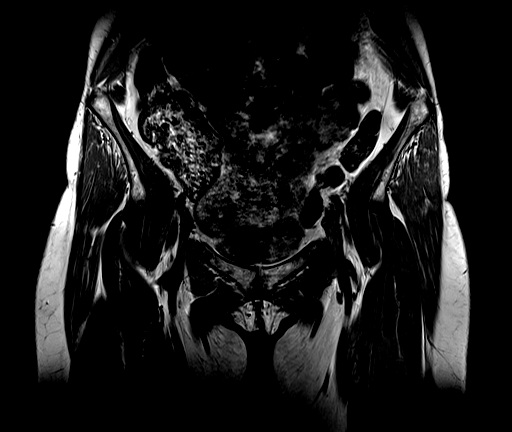
[im 30/30]
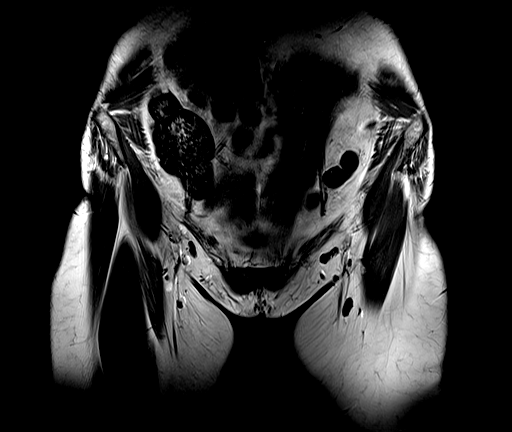

[Series 11: T1 · axial · left · 3.0mm · 0.78mm/px · z∈[-84,+21]mm · 9 of 30 slices shown (2 of 2)]
[im 1/30]
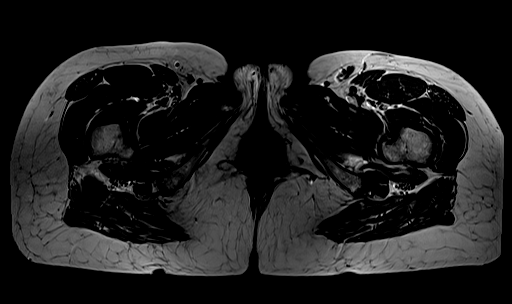
[im 4/30]
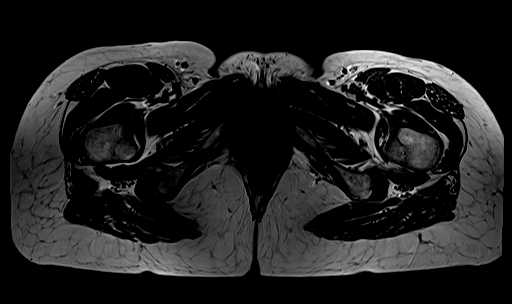
[im 8/30]
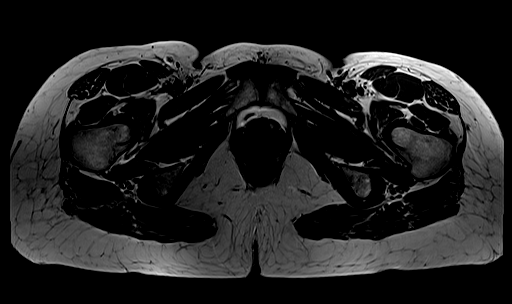
[im 11/30]
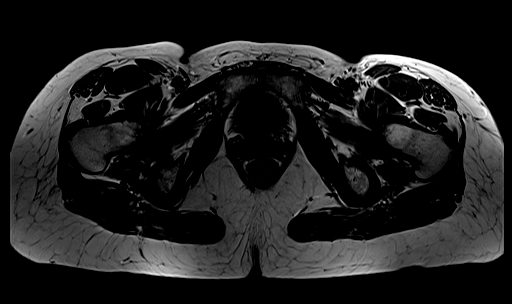
[im 15/30]
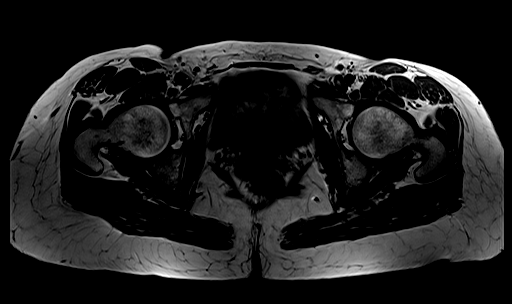
[im 19/30]
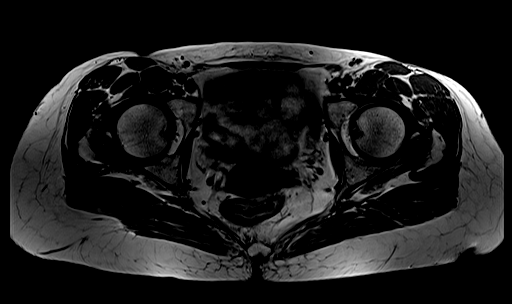
[im 22/30]
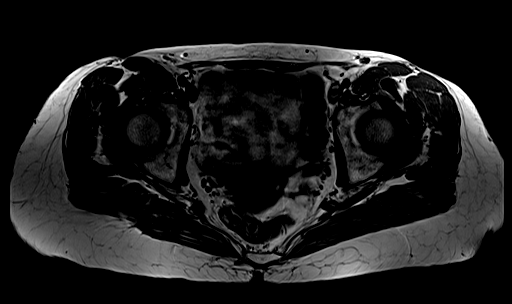
[im 26/30]
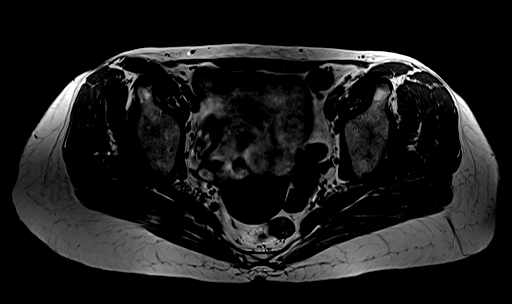
[im 30/30]
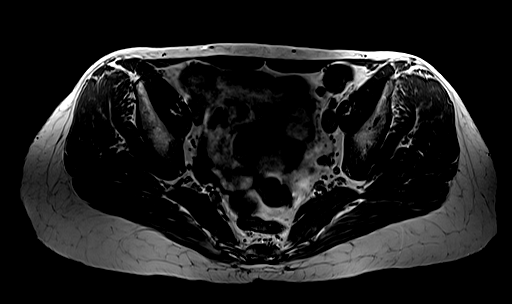

[Series 12: T2 · axial · left · 3.0mm · 0.78mm/px · z∈[-84,-58]mm · 3 of 30 slices shown]
[im 1/30]
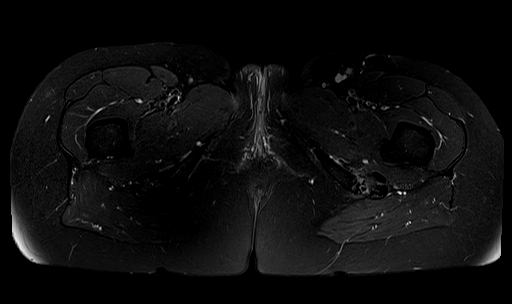
[im 4/30]
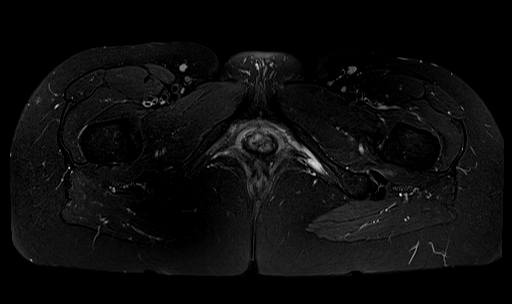
[im 8/30]
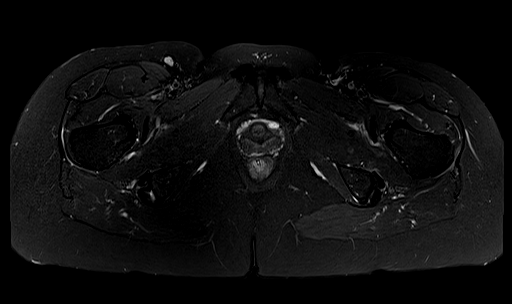

[Series 13: PD · sagittal · left · 3.0mm · 0.56mm/px · 7 of 25 slices shown]
[im 1/25]
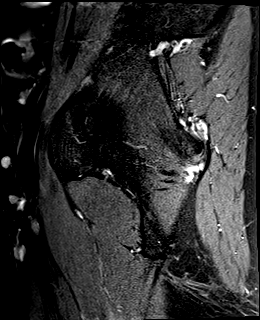
[im 5/25]
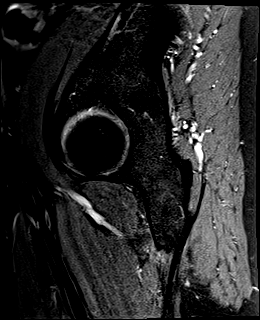
[im 9/25]
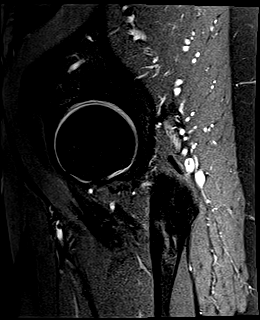
[im 13/25]
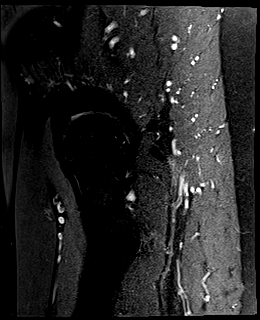
[im 17/25]
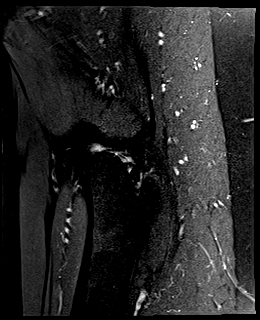
[im 21/25]
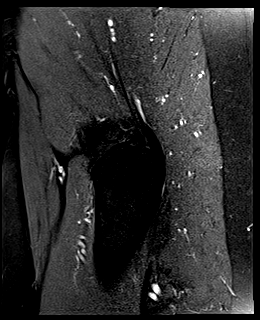
[im 25/25]
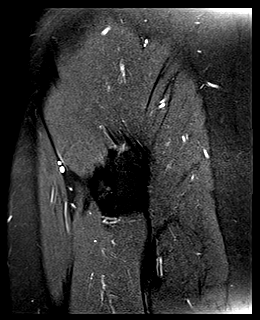

[34 of 40 positions shown; findings below may reference images not displayed]

FINDINGS: Both hips are normally located. Minimal degenerative changes. No
stress fracture or AVN. No joint effusion. No cartilage defects or
obvious labral tear.

Mild bilateral peritendinosis without trochanteric bursitis.

The surrounding hip and pelvic musculature appear normal. No muscle
tear, myositis or fatty atrophy. There is moderate bilateral
hamstring tendinopathy, left greater than right

The pubic symphysis and SI joints are intact. Minimal degenerative
changes. No pelvic stress fracture or bone lesions.

No significant intrapelvic abnormalities are. No inguinal mass or
adenopathy or hernia.
IMPRESSION: 1. Minimal degenerative changes.  No stress fracture or AVN.
2. Bilateral hamstring tendinopathy, left greater than right.
3. Mild bilateral peritendinosis without trochanteric bursitis.
4. Normal appearance of the bony pelvis.
5. No significant intrapelvic abnormalities.

## 2017-11-28 ENCOUNTER — Telehealth: Payer: Self-pay | Admitting: Family Medicine

## 2017-11-28 NOTE — Telephone Encounter (Signed)
Copied from [place] [id number]. Topic: Quick Communication - See Telephone Encounter >> 12-21-17  3:42 PM Cleaster Corin, NT wrote: CRM for notification. See Telephone encounter for: 12/21/17.  Pt. Needing codes to have mmr shot for insurance. And the reason for injection ([employer]) pt. Would like to have nurse give her a call back with info. [phone]

## 2017-11-28 NOTE — Telephone Encounter (Signed)
Message sent to billing for CPT numbers. Awaiting response.

## 2017-11-29 NOTE — Telephone Encounter (Signed)
CPT [phone] ($130.00) is the MMR vaccine CPT and [phone]($49.00) is admin for adult.

## 2017-11-29 NOTE — Telephone Encounter (Signed)
Add vaccination to pt's immunization historical information

## 2017-11-29 NOTE — Telephone Encounter (Signed)
Called and spoke with pt. Pt advised and voiced understanding.   Pt stated that she had her [facility]. Pt stated that they should be faxing [place] the information.

## 2017-12-04 ENCOUNTER — Encounter: Payer: Self-pay | Admitting: Family Medicine

## 2017-12-05 ENCOUNTER — Telehealth: Payer: Self-pay | Admitting: *Deleted

## 2017-12-05 NOTE — Telephone Encounter (Signed)
   Returned call upon receipt of voicemail "requesting blood type before trip to go out of the country."    No transfusions  noted per Epic conversion from 2006 through today.  Patient denies receipt of blood transfusions in her lifetime.    Answered question of "Why was blood type not checked when I have a blood disorder".    Recommended inquiry of the [employer] for recommendation how/where to receive blood type information or pay out of pocket from lab.  Insurance may see Type and Screen unnecessary without a need for possible blood transfusion or diagnosis to support need for testing.  Denies further questions or needs at this time.

## 2017-12-13 ENCOUNTER — Encounter: Payer: Self-pay | Admitting: Family Medicine

## 2018-05-14 LAB — HM MAMMOGRAPHY

## 2019-02-21 ENCOUNTER — Encounter: Payer: Self-pay | Admitting: Family Medicine

## 2019-02-21 MED ORDER — CYCLOBENZAPRINE HCL 5 MG PO TABS: ORAL_TABLET | ORAL | 1 refills | Status: DC

## 2019-02-21 NOTE — Telephone Encounter (Signed)
I sent in a refill of the Cyclobenzaprine.

## 2019-06-24 ENCOUNTER — Encounter: Payer: Self-pay | Admitting: Family Medicine

## 2019-07-28 ENCOUNTER — Encounter: Payer: Self-pay | Admitting: Family Medicine

## 2019-07-29 ENCOUNTER — Other Ambulatory Visit: Payer: Self-pay

## 2019-07-29 ENCOUNTER — Encounter: Payer: Self-pay | Admitting: Family Medicine

## 2019-07-29 ENCOUNTER — Telehealth (INDEPENDENT_AMBULATORY_CARE_PROVIDER_SITE_OTHER): Payer: BC Managed Care – PPO | Admitting: Family Medicine

## 2019-07-29 DIAGNOSIS — J019 Acute sinusitis, unspecified: Secondary | ICD-10-CM

## 2019-07-29 MED ORDER — AZITHROMYCIN 250 MG PO TABS: ORAL_TABLET | ORAL | 0 refills | Status: AC

## 2019-07-29 NOTE — Progress Notes (Signed)
,  bwbcov  Virtual Visit via Telephone Note  I connected with Natalie Carpenter on 07/29/19 at  2:45 PM EST by telephone and verified that I am speaking with the correct person using two identifiers.   I discussed the limitations, risks, security and privacy concerns of performing an evaluation and management service by telephone and the availability of in person appointments. I also discussed with the patient that there may be a patient responsible charge related to this service. The patient expressed understanding and agreed to proceed.  Location patient: home Location provider: work or home office Participants present for the call: patient, provider Patient did not have a visit in the prior 7 days to address this/these issue(s).   History of Present Illness: Natalie Carpenter relates over 2-week history of sinus congestion.  She has had some mostly left sided sinus pain over her maxillary and frontal sinus region.  She had some greenish nasal discharge.  Some postnasal drip.  She tried over-the-counter medications including Sudafed and Synex nasal spray without improvement.  She has had some intermittent headaches and muscle aches in her neck only.  No generalized aches.  No fever.  She has been extremely isolated and no sick contacts.  No cough.  No past medical history on file. Past Surgical History:  Procedure Laterality Date  . HERNIA REPAIR  [id number]   umbilical hernia    reports that she has never smoked. She has never used smokeless tobacco. She reports current alcohol use. She reports that she does not use drugs. family history includes Cancer in her father; Sarcoidosis in her sister. Allergies  Allergen Reactions  . Sulfonamide Derivatives     REACTION: rash      Observations/Objective: Patient sounds cheerful and well on the phone. I do not appreciate any SOB. Speech and thought processing are grossly intact. Patient reported vitals:  Assessment and Plan:  Probable acute left maxillary  sinusitis  -Patient states that Zithromax has worked well for her in the past.  We explained that current guidelines do not recommend Zithromax as a first-line because of resistance patterns but she insists that she would like to try that antibiotic first since it has worked so well for her previously.  We have recommended being in touch in a week or so if not improved may need to broaden coverage either doxycycline or Augmentin -Plenty fluids and rest.  Avoid regular use of decongestant.  Consider nasal saline irrigation regularly. -Follow-up for any persistent or worsening symptoms  Follow Up Instructions:  -As above   [id number] [id number] [id number] 11-20 [id number] 21-30 I did not refer this patient for an OV in the next 24 hours for this/these issue(s).  I discussed the assessment and treatment plan with the patient. The patient was provided an opportunity to ask questions and all were answered. The patient agreed with the plan and demonstrated an understanding of the instructions.   The patient was advised to call back or seek an in-person evaluation if the symptoms worsen or if the condition fails to improve as anticipated.  I provided 15  minutes of non-face-to-face time during this encounter.   Carolann Littler, MD

## 2019-08-07 ENCOUNTER — Other Ambulatory Visit: Payer: Self-pay

## 2019-08-07 ENCOUNTER — Encounter: Payer: Self-pay | Admitting: Family Medicine

## 2019-08-07 ENCOUNTER — Telehealth: Payer: Self-pay | Admitting: Family Medicine

## 2019-08-07 ENCOUNTER — Telehealth (INDEPENDENT_AMBULATORY_CARE_PROVIDER_SITE_OTHER): Payer: BC Managed Care – PPO | Admitting: Family Medicine

## 2019-08-07 VITALS — Wt 132.0 lb

## 2019-08-07 DIAGNOSIS — J3489 Other specified disorders of nose and nasal sinuses: Secondary | ICD-10-CM | POA: Diagnosis not present

## 2019-08-07 DIAGNOSIS — M542 Cervicalgia: Secondary | ICD-10-CM

## 2019-08-07 DIAGNOSIS — R0981 Nasal congestion: Secondary | ICD-10-CM | POA: Diagnosis not present

## 2019-08-07 MED ORDER — DOXYCYCLINE HYCLATE 100 MG PO TABS: 100.0000 mg | ORAL_TABLET | Freq: Two times a day (BID) | ORAL | 0 refills | Status: DC

## 2019-08-07 NOTE — Progress Notes (Signed)
Virtual Visit via Telephone Note  I connected with Natalie Carpenter on 08/07/19 at  4:00 PM EST by telephone and verified that I am speaking with the correct person using two identifiers.   I discussed the limitations, risks, security and privacy concerns of performing an evaluation and management service by telephone and the availability of in person appointments. I also discussed with the patient that there may be a patient responsible charge related to this service. The patient expressed understanding and agreed to proceed.  Location patient: home Location provider: work or home office Participants present for the call: patient, provider Patient did not have a visit in the prior 7 days to address this/these issue(s).   History of Present Illness:   Acute visit for "sinusitis": -started several weeks ago and then worsened the last 2 weeks -reports has been dealing with thick discolored sinus congestion, maxillary sinus pain, upper left teeth pain, some aches in the neck and shoulders -she reports she always gets neck/shoulder pain when gets a sinus infectioin -she tried azithromycin but then worsened when zpack finished  -reports PCP had advised a stronger abx, but she wanted to try azithromycin first -history of ocular migraines, allergies -no fevers, NV, SOB, stiff neck -reports stays home, nobody goes outside the home - reports they are paranoid cautious - all work from home -non known sick exposures, has had first COVID19 vaccine  Observations/Objective: Patient sounds cheerful and well on the phone. I do not appreciate any SOB. Speech and thought processing are grossly intact. Patient reported vitals:  Assessment and Plan:  Sinus congestion  Sinus pain  Neck pain  -we discussed possible serious and likely etiologies, options for evaluation and workup, limitations of telemedicine visit vs in person visit, treatment, treatment risks and precautions. Pt prefers to treat via  telemedicine empirically rather then risking or undertaking an in person visit at this moment. Thick sinus mucus, sinus pain, max tooth pain, duration of symptoms suggest partially treated sinusitis. Advised Augment or Doxy are preferred regimens for sinusitis and she wants to try doxy 100mg  bid x10 days. Query underlying migraine d/o given sinus issues trigger neck pain. Patient agrees to seek prompt in person care and may need further evaluation and ENT referral if worsening, new symptoms arise, or if symptoms do not resolve with treatment.  Follow Up Instructions:  I did not refer this patient for an OV in the next 24 hours for this/these issue(s).  I discussed the assessment and treatment plan with the patient. The patient was provided an opportunity to ask questions and all were answered. The patient agreed with the plan and demonstrated an understanding of the instructions.   The patient was advised to call back or seek an in-person evaluation if the symptoms worsen or if the condition fails to improve as anticipated.  I provided 22 minutes of non-face-to-face time during this encounter.   Lucretia Kern, DO

## 2019-08-07 NOTE — Patient Instructions (Signed)
-  I sent the medication(s) we discussed to your pharmacy: Meds ordered this encounter  Medications  . doxycycline (VIBRA-TABS) 100 MG tablet    Sig: Take 1 tablet (100 mg total) by mouth 2 (two) times daily.    Dispense:  20 tablet    Refill:  0    Please let us know if you have any questions or concerns regarding this prescription.  I hope you are feeling better soon! Seek care promptly if your symptoms worsen, new concerns arise or  If your symptoms do not resolve with treatment.

## 2019-08-07 NOTE — Telephone Encounter (Signed)
Patient seen Dr. Elease Hashimoto for a telephone visit on 02/16 and she has finished the pill pack on 02/20.  She now has the neck pain again and the mucus is back in her sinuses.  She was wondering if there is another medication that can be called in to the pharmacy besides the pill pack that was called in on 02/16.  She was wondering if diclofenac can be called in to her pharmacy.

## 2019-08-08 NOTE — Telephone Encounter (Signed)
Patient notified and verbalized understanding. 

## 2019-08-08 NOTE — Telephone Encounter (Signed)
Message forwarded to PCP as the patient had a virtual visit with Dr Maudie Mercury on 2/25.

## 2019-08-08 NOTE — Telephone Encounter (Signed)
I have already addressed the antibiotic on another note.  I do not think we have prescribed Diclofenac   would use Aleve 1-2 every 12 hours which should be just as effective as diclofenac and better cardiac safety.

## 2019-08-11 ENCOUNTER — Encounter: Payer: Self-pay | Admitting: Family Medicine

## 2019-08-16 ENCOUNTER — Other Ambulatory Visit: Payer: Self-pay

## 2019-08-16 ENCOUNTER — Encounter: Payer: Self-pay | Admitting: Internal Medicine

## 2019-08-16 ENCOUNTER — Telehealth (INDEPENDENT_AMBULATORY_CARE_PROVIDER_SITE_OTHER): Payer: BC Managed Care – PPO | Admitting: Internal Medicine

## 2019-08-16 DIAGNOSIS — R519 Headache, unspecified: Secondary | ICD-10-CM | POA: Diagnosis not present

## 2019-08-16 DIAGNOSIS — Z889 Allergy status to unspecified drugs, medicaments and biological substances status: Secondary | ICD-10-CM

## 2019-08-16 DIAGNOSIS — J3489 Other specified disorders of nose and nasal sinuses: Secondary | ICD-10-CM

## 2019-08-16 NOTE — Progress Notes (Signed)
Virtual Visit via Video Note  I connected with@ on 08/16/19 at 11:00 AM EST by a video enabled telemedicine application and verified that I am speaking with the correct person using two identifiers. Location patient: home Location provider:work office Persons participating in the virtual visit: patient, provider  [place] national recommendations  regarding COVID 19 pandemic   video visit is advised over in office visit for this patient.  Patient aware  of the limitations of evaluation and management by telemedicine and  availability of in person appointments. and agreed to proceed.   HPI: Natalie Carpenter presents for video visit  Patient prsents  today for [facility] Saturday clinic for  On going problem evaluation. Co ongoing sinus pressure and headaches  Congestion. To have second covid vaccine in 2 days and has some concern On day n8-9 of doxycycline and is some better but residual sx  Allegra helped sinus pressure but felt to drying . ROS: See pertinent positives and negatives per HPI.  History reviewed. No pertinent past medical history.  Past Surgical History:  Procedure Laterality Date  . HERNIA REPAIR  [id number]   umbilical hernia    Family History  Problem Relation Age of Onset  . Cancer Father        prostate  . Sarcoidosis Sister     Social History   Tobacco Use  . Smoking status: Never Smoker  . Smokeless tobacco: Never Used  Substance Use Topics  . Alcohol use: Yes    Alcohol/week: 0.0 standard drinks    Comment: occ  . Drug use: No      Current Outpatient Medications:  .  doxycycline (VIBRA-TABS) 100 MG tablet, Take 1 tablet (100 mg total) by mouth 2 (two) times daily., Disp: 20 tablet, Rfl: 0 .  Fexofenadine HCl (ALLEGRA PO), Take by mouth as needed., Disp: , Rfl:  .  Multiple Vitamins-Minerals (WOMENS MULTI VITAMIN & MINERAL PO), Take by mouth daily.  , Disp: , Rfl:  .  Probiotic Product (PRO-BIOTIC BLEND PO), Take by mouth as needed., Disp: , Rfl:   EXAM: BP  Readings from Last 3 Encounters:  10/19/17 110/70  07/30/17 103/75  07/13/17 100/70    VITALS per patient if applicable:  GENERAL: alert, oriented, appears well and in no acute distress sounds mildly congested  No swelling  No cough   HEENT: atraumatic, conjunttiva clear, no obvious abnormalities on inspection of external nose and ears NECK: normal movements of the head and neck LUNGS: on inspection no signs of respiratory distress, breathing rate appears normal, no obvious gross SOB, gasping or wheezing  CV: no obvious cyanosis  PSYCH/NEURO: pleasant and cooperative, no obvious depression or anxiety, speech and thought processing grossly intact Lab Results  Component Value Date   WBC 7.1 10/19/2017   HGB 11.9 10/19/2017   HCT 35.3 10/19/2017   PLT 281 10/19/2017   GLUCOSE 91 10/19/2017   CHOL 217 (H) 10/19/2017   TRIG 76 10/19/2017   HDL 65 10/19/2017   LDLDIRECT 160.1 01/30/2013   LDLCALC 135 (H) 10/19/2017   ALT 9 10/19/2017   AST 13 10/19/2017   NA 142 10/19/2017   K 4.9 10/19/2017   CL 106 10/19/2017   CREATININE 0.76 10/19/2017   BUN 16 10/19/2017   CO2 28 10/19/2017   TSH 1.29 10/19/2017    ASSESSMENT AND PLAN:  Discussed the following assessment and plan:    ICD-10-CM   1. Sinus pain  J34.89   2. Nonintractable headache, unspecified chronicity pattern, unspecified headache  type  R51.9    related to above?  3. History of allergy  Z88.9    3rd contact about the sinus problem   rx z pack then doxy  Concerning for chronic  sinusitis or other diagnosis  Disc  antibiotics may or may not work option to extend antibiotic or change to [facility] for 5 day( she has gi se of Augmentin)     Add saline and flonase senisspray  For 2 weeks   [name] seems atypical and says feel achy  Never had covid testing since " never goes anywhere"    Ok to get the vaccine but expectant management consider ation of  further eval  ? Ct sinus   Other reval if  persistent or progressive   Counseled.   Expectant management and discussion of plan and treatment with opportunity to ask questions and all were answered. The patient agreed with the plan and demonstrated an understanding of the instructions.   Advised to call back or seek an in-person evaluation if worsening  or having  further concerns . Return if symptoms worsen or fail to improve as expected  with pcp.  Shanon Ace, MD

## 2019-09-02 ENCOUNTER — Encounter: Payer: Self-pay | Admitting: Family Medicine

## 2019-09-04 ENCOUNTER — Other Ambulatory Visit: Payer: Self-pay

## 2019-09-05 ENCOUNTER — Ambulatory Visit (INDEPENDENT_AMBULATORY_CARE_PROVIDER_SITE_OTHER): Payer: BC Managed Care – PPO | Admitting: Family Medicine

## 2019-09-05 ENCOUNTER — Encounter: Payer: Self-pay | Admitting: Family Medicine

## 2019-09-05 VITALS — BP 110/62 | HR 60 | Temp 97.6°F | Wt 128.9 lb

## 2019-09-05 DIAGNOSIS — Z0000 Encounter for general adult medical examination without abnormal findings: Secondary | ICD-10-CM | POA: Diagnosis not present

## 2019-09-05 LAB — CBC WITH DIFFERENTIAL/PLATELET
Basophils Absolute: 0 10*3/uL (ref 0.0–0.1)
Basophils Relative: 0.6 % (ref 0.0–3.0)
Eosinophils Absolute: 0.3 10*3/uL (ref 0.0–0.7)
Eosinophils Relative: 4.9 % (ref 0.0–5.0)
HCT: 36.8 % (ref 36.0–46.0)
Hemoglobin: 12 g/dL (ref 12.0–15.0)
Lymphocytes Relative: 23.7 % (ref 12.0–46.0)
Lymphs Abs: 1.6 10*3/uL (ref 0.7–4.0)
MCHC: 32.6 g/dL (ref 30.0–36.0)
MCV: 90.9 fl (ref 78.0–100.0)
Monocytes Absolute: 0.4 10*3/uL (ref 0.1–1.0)
Monocytes Relative: 6.2 % (ref 3.0–12.0)
Neutro Abs: 4.3 10*3/uL (ref 1.4–7.7)
Neutrophils Relative %: 64.6 % (ref 43.0–77.0)
Platelets: 256 10*3/uL (ref 150.0–400.0)
RBC: 4.05 Mil/uL (ref 3.87–5.11)
RDW: 13.7 % (ref 11.5–15.5)
WBC: 6.7 10*3/uL (ref 4.0–10.5)

## 2019-09-05 LAB — LIPID PANEL
Cholesterol: 219 mg/dL — ABNORMAL HIGH (ref 0–200)
HDL: 64.9 mg/dL (ref >39.00)
LDL Cholesterol: 144 mg/dL — ABNORMAL HIGH (ref 0–99)
NonHDL: 154.34
Total CHOL/HDL Ratio: 3
Triglycerides: 50 mg/dL (ref 0.0–149.0)
VLDL: 10 mg/dL (ref 0.0–40.0)

## 2019-09-05 LAB — TSH: TSH: 1.3 u[IU]/mL (ref 0.35–4.50)

## 2019-09-05 LAB — HEPATIC FUNCTION PANEL
ALT: 10 U/L (ref 0–35)
AST: 13 U/L (ref 0–37)
Albumin: 4.4 g/dL (ref 3.5–5.2)
Alkaline Phosphatase: 61 U/L (ref 39–117)
Bilirubin, Direct: 0.1 mg/dL (ref 0.0–0.3)
Total Bilirubin: 0.6 mg/dL (ref 0.2–1.2)
Total Protein: 6.7 g/dL (ref 6.0–8.3)

## 2019-09-05 LAB — BASIC METABOLIC PANEL
BUN: 8 mg/dL (ref 6–23)
CO2: 28 mEq/L (ref 19–32)
Calcium: 9.7 mg/dL (ref 8.4–10.5)
Chloride: 106 mEq/L (ref 96–112)
Creatinine, Ser: 0.74 mg/dL (ref 0.40–1.20)
GFR: 80.05 mL/min (ref >60.00)
Glucose, Bld: 80 mg/dL (ref 70–99)
Potassium: 4.6 mEq/L (ref 3.5–5.1)
Sodium: 140 mEq/L (ref 135–145)

## 2019-09-05 NOTE — Progress Notes (Signed)
Subjective:     Patient ID: Natalie Carpenter, female   DOB: Oct 30, 1960, 59 y.o.   MRN: [id number]  HPI Salicia seen for complete physical.  She sees gynecologist for Pap smears and mammograms.  Generally very healthy.  She does not take any regular medications by prescription.  Already had Covid vaccinations.  No history of shingles vaccine but she declines at this time.  She is in process of getting set up for repeat mammogram.  Colonoscopy due 2027.  Tetanus due 2029.  Previous hepatitis C screen negative.  Gets yearly flu vaccine.  Was swimming for exercise until the pandemic.  Has been walking more for exercise currently  No past medical history on file. Past Surgical History:  Procedure Laterality Date  . HERNIA REPAIR  [id number]   umbilical hernia    reports that she has never smoked. She has never used smokeless tobacco. She reports current alcohol use. She reports that she does not use drugs. family history includes Cancer in her father; Sarcoidosis in her sister. Allergies  Allergen Reactions  . Sulfonamide Derivatives     REACTION: rash   Wt Readings from Last 3 Encounters:  09/05/19 128 lb 14.4 oz (58.5 kg)  08/07/19 132 lb (59.9 kg)  10/19/17 133 lb 4.8 oz (60.5 kg)     Review of Systems  Constitutional: Negative for activity change, appetite change, fatigue, fever and unexpected weight change.  HENT: Negative for ear pain, hearing loss, sore throat and trouble swallowing.   Eyes: Negative for visual disturbance.  Respiratory: Negative for cough and shortness of breath.   Cardiovascular: Negative for chest pain and palpitations.  Gastrointestinal: Negative for abdominal pain, blood in stool, constipation and diarrhea.  Genitourinary: Negative for dysuria and hematuria.  Musculoskeletal: Negative for arthralgias, back pain and myalgias.  Skin: Negative for rash.  Neurological: Negative for dizziness, syncope, weakness and headaches.  Hematological: Negative for adenopathy.   Psychiatric/Behavioral: Negative for confusion and dysphoric mood.       Objective:   Physical Exam Vitals reviewed.  Constitutional:      General: She is not in acute distress.    Appearance: Normal appearance. She is not ill-appearing.  HENT:     Head: Normocephalic and atraumatic.     Right Ear: Tympanic membrane normal.     Left Ear: Tympanic membrane normal.  Cardiovascular:     Rate and Rhythm: Normal rate and regular rhythm.  Pulmonary:     Effort: Pulmonary effort is normal.     Breath sounds: Normal breath sounds.  Abdominal:     General: Bowel sounds are normal. There is no distension.     Palpations: Abdomen is soft. There is no mass.     Tenderness: There is no abdominal tenderness. There is no guarding or rebound.  Musculoskeletal:     Cervical back: Neck supple.     Right lower leg: No edema.     Left lower leg: No edema.  Lymphadenopathy:     Cervical: No cervical adenopathy.  Neurological:     General: No focal deficit present.     Mental Status: She is alert.        Assessment:     Physical exam.  Patient has forms to complete for substitute teaching.  She is low risk for tuberculosis.  Form requires screening for TB    Plan:     -Obtain screening labs.  Will include QuantiFERON gold plus assay  -Continue regular weightbearing exercise  -Discussed shingles vaccine and  she wishes to wait since she just got her Covid vaccine not long ago  -She plans to continue with GYN follow-up for mammograms and Pap smears.  She will discuss possible DEXA screening with them  Eulas Post MD [facility]

## 2019-09-05 NOTE — Patient Instructions (Signed)

## 2019-09-07 ENCOUNTER — Encounter: Payer: Self-pay | Admitting: Family Medicine

## 2019-09-07 LAB — QUANTIFERON-TB GOLD PLUS
Mitogen-NIL: 8.65 IU/mL
NIL: 0.03 IU/mL
QuantiFERON-TB Gold Plus: NEGATIVE
TB1-NIL: <0 IU/mL
TB2-NIL: <0 IU/mL

## 2019-10-08 ENCOUNTER — Other Ambulatory Visit: Payer: Self-pay | Admitting: Obstetrics

## 2019-10-08 DIAGNOSIS — E2839 Other primary ovarian failure: Secondary | ICD-10-CM

## 2019-10-10 ENCOUNTER — Encounter: Payer: Self-pay | Admitting: Family Medicine

## 2019-12-11 ENCOUNTER — Other Ambulatory Visit: Payer: Self-pay

## 2019-12-11 ENCOUNTER — Ambulatory Visit
Admission: RE | Admit: 2019-12-11 | Discharge: 2019-12-11 | Disposition: A | Discharge status: Home / Self Care | Payer: BC Managed Care – PPO | Source: Ambulatory Visit | Attending: Obstetrics | Admitting: Obstetrics

## 2019-12-11 DIAGNOSIS — E2839 Other primary ovarian failure: Secondary | ICD-10-CM

## 2019-12-25 ENCOUNTER — Telehealth: Payer: Self-pay | Admitting: Family Medicine

## 2019-12-25 NOTE — Telephone Encounter (Signed)
Patient was diagnosed with Osteoprosis and want to know if there is physical therapy or occupational therapy, none drug related, like diet or exercise approach.  Please advise

## 2019-12-26 NOTE — Telephone Encounter (Signed)
Happy to refer, but orthopedics does not usually manage osteoporosis.  I am assuming she has discussed with her gyn.  Most osteoporosis is managed by GYN, primary care, or possibly endocrinology but not usually orthopedic surgery.  Need clarification.  It sounded like she was requesting referral for physical therapy possibly?  Would also offer follow-up here to discuss various options

## 2019-12-26 NOTE — Telephone Encounter (Signed)
Pt made appt to discuss options

## 2019-12-26 NOTE — Telephone Encounter (Signed)
Pt  wants to know if she can have referral to [place] [facility]. Offered appt to discuss treatment options pt asked for referral instead

## 2020-01-02 ENCOUNTER — Encounter: Payer: Self-pay | Admitting: Family Medicine

## 2020-01-02 ENCOUNTER — Other Ambulatory Visit: Payer: Self-pay

## 2020-01-02 ENCOUNTER — Ambulatory Visit: Payer: BC Managed Care – PPO | Admitting: Family Medicine

## 2020-01-02 VITALS — BP 120/70 | HR 60 | Temp 97.5°F | Ht 66.5 in | Wt 131.1 lb

## 2020-01-02 DIAGNOSIS — M810 Age-related osteoporosis without current pathological fracture: Secondary | ICD-10-CM | POA: Insufficient documentation

## 2020-01-02 NOTE — Progress Notes (Signed)
Established Patient Office Visit  Subjective:  Patient ID: Natalie Carpenter, female    DOB: 06-Feb-1961  Age: 59 y.o. MRN: [id number]  CC:  Chief Complaint  Patient presents with  . dexa results    HPI [name] presents for osteoporosis discussion.  She had initial DEXA scan per GYN recently.  This revealed osteoporosis with T score -2.7 involving the spine.  She had -2.3 left femur.  There was recommendation to consider medications such as bisphosphonate but patient declined.  Generally stays very active.  She has done a lot of swimming in the past.  She is currently walking some for exercise.  She currently does not do any upper body strengthening exercises.  No history of fracture.  Her father reportedly had fall with hip fracture 46.  Her mom is 48 and not sure of her bone density status.  [name] has never had any chronic steroid use.  She had chemistries and thyroid function with lab back in March which were stable.  She has taken some calcium and vitamin D though not always consistently  History reviewed. No pertinent past medical history.  Past Surgical History:  Procedure Laterality Date  . HERNIA REPAIR  0347   umbilical hernia    Family History  Problem Relation Age of Onset  . Cancer Father        prostate  . Sarcoidosis Sister     Social History   Socioeconomic History  . Marital status: Married    Spouse name: Not on file  . Number of children: Not on file  . Years of education: Not on file  . Highest education level: Not on file  Occupational History  . Not on file  Tobacco Use  . Smoking status: Never Smoker  . Smokeless tobacco: Never Used  Substance and Sexual Activity  . Alcohol use: Yes    Alcohol/week: 0.0 standard drinks    Comment: occ  . Drug use: No  . Sexual activity: Not on file  Other Topics Concern  . Not on file  Social History Narrative  . Not on file   Social Determinants of Health   Financial Resource Strain:   . Difficulty of  Paying Living Expenses:   Food Insecurity:   . Worried About Charity fundraiser in the Last Year:   . Arboriculturist in the Last Year:   Transportation Needs:   . Film/video editor (Medical):   Marland Kitchen Lack of Transportation (Non-Medical):   Physical Activity:   . Days of Exercise per Week:   . Minutes of Exercise per Session:   Stress:   . Feeling of Stress :   Social Connections:   . Frequency of Communication with Friends and Family:   . Frequency of Social Gatherings with Friends and Family:   . Attends Religious Services:   . Active Member of Clubs or Organizations:   . Attends Archivist Meetings:   Marland Kitchen Marital Status:   Intimate Partner Violence:   . Fear of Current or Ex-Partner:   . Emotionally Abused:   Marland Kitchen Physically Abused:   . Sexually Abused:     Outpatient Medications Prior to Visit  Medication Sig Dispense Refill  . fluticasone (FLONASE SENSIMIST) 27.5 MCG/SPRAY nasal spray     . Loratadine 10 MG CAPS     . Multiple Vitamins-Minerals (WOMENS MULTI VITAMIN & MINERAL PO) Take by mouth daily.      . Probiotic Product (PRO-BIOTIC BLEND PO) Take by  mouth as needed.     No facility-administered medications prior to visit.    Allergies  Allergen Reactions  . Sulfonamide Derivatives     REACTION: rash    ROS Review of Systems  Constitutional: Negative for chills and fever.  Respiratory: Negative for shortness of breath.   Cardiovascular: Negative for chest pain.  Musculoskeletal: Negative for arthralgias and back pain.  Neurological: Negative for weakness.      Objective:    Physical Exam Vitals reviewed.  Constitutional:      Appearance: Normal appearance.  Cardiovascular:     Rate and Rhythm: Normal rate and regular rhythm.  Pulmonary:     Effort: Pulmonary effort is normal.     Breath sounds: Normal breath sounds.  Musculoskeletal:     Right lower leg: No edema.     Left lower leg: No edema.  Neurological:     Mental Status: She is  alert.     BP 120/70   Pulse 60   Temp (!) 97.5 F (36.4 C) (Oral)   Ht 5' 6.5" (1.689 m)   Wt 131 lb 1 oz (59.4 kg)   LMP 09/03/2010   SpO2 97%   BMI 20.84 kg/m  Wt Readings from Last 3 Encounters:  01/02/20 131 lb 1 oz (59.4 kg)  09/05/19 128 lb 14.4 oz (58.5 kg)  08/07/19 132 lb (59.9 kg)     Health Maintenance Due  Topic Date Due  . HIV Screening  Never done  . PAP SMEAR-Modifier  03/23/2018    There are no preventive care reminders to display for this patient.  Lab Results  Component Value Date   TSH 1.30 09/05/2019   Lab Results  Component Value Date   WBC 6.7 09/05/2019   HGB 12.0 09/05/2019   HCT 36.8 09/05/2019   MCV 90.9 09/05/2019   PLT 256.0 09/05/2019   Lab Results  Component Value Date   NA 140 09/05/2019   K 4.6 09/05/2019   CO2 28 09/05/2019   GLUCOSE 80 09/05/2019   BUN 8 09/05/2019   CREATININE 0.74 09/05/2019   BILITOT 0.6 09/05/2019   ALKPHOS 61 09/05/2019   AST 13 09/05/2019   ALT 10 09/05/2019   PROT 6.7 09/05/2019   ALBUMIN 4.4 09/05/2019   CALCIUM 9.7 09/05/2019   GFR 80.05 09/05/2019   Lab Results  Component Value Date   CHOL 219 (H) 09/05/2019   Lab Results  Component Value Date   HDL 64.90 09/05/2019   Lab Results  Component Value Date   LDLCALC 144 (H) 09/05/2019   Lab Results  Component Value Date   TRIG 50.0 09/05/2019   Lab Results  Component Value Date   CHOLHDL 3 09/05/2019   No results found for: HGBA1C    Assessment & Plan:   Problem List Items Addressed This Visit    None    Visit Diagnoses    Osteoporosis without current pathological fracture, unspecified osteoporosis type    -  Primary   Relevant Orders   Ambulatory referral to Physical Therapy    -Patient requesting referral to physical therapy to instruct in some good upper extremity exercises and this order was placed  -We had a long discussion regarding multiple potential therapies for osteoporosis.  She is reluctant to the idea of  any prescription medications at this point after discussing balance of risk and benefits.  -We have advised that she continue regular weightbearing exercise.  Also suggested that she consider trying exercise such as jump  rope  -Continue with calcium 1200 mg daily and vitamin D 800 to 1000 international units daily  -Consider repeat DEXA scan within 2 years  30 minutes spent reviewing recent DEXA scan and discussing pharmacologic and non-pharmacologic factors in treating/ slowing progression of osteoporosis.   No orders of the defined types were placed in this encounter.   Follow-up: No follow-ups on file.    Carolann Littler, MD

## 2020-01-02 NOTE — Patient Instructions (Addendum)
Osteoporosis  Osteoporosis is thinning and loss of density in your bones. Osteoporosis makes bones more brittle and fragile and more likely to break (fracture). Over time, osteoporosis can cause your bones to become so weak that they fracture after a minor fall. Bones in the hip, wrist, and spine are most likely to fracture due to osteoporosis. What are the causes? The exact cause of this condition is not known. What increases the risk? You may be at greater risk for osteoporosis if you:  Have a family history of the condition.  Have poor nutrition.  Use steroid medicines, such as prednisone.  Are female.  Are age 50 or older.  Smoke or have a history of smoking.  Are not physically active (are sedentary).  Are white (Caucasian) or of Asian descent.  Have a small body frame.  Take certain medicines, such as antiseizure medicines. What are the signs or symptoms? A fracture might be the first sign of osteoporosis, especially if the fracture results from a fall or injury that usually would not cause a bone to break. Other signs and symptoms include:  Pain in the neck or low back.  Stooped posture.  Loss of height. How is this diagnosed? This condition may be diagnosed based on:  Your medical history.  A physical exam.  A bone mineral density test, also called a DXA or DEXA test (dual-energy X-ray absorptiometry test). This test uses X-rays to measure the amount of minerals in your bones. How is this treated? The goal of treatment is to strengthen your bones and lower your risk for a fracture. Treatment may involve:  Making lifestyle changes, such as: ? Including foods with more calcium and vitamin D in your diet. ? Doing weight-bearing and muscle-strengthening exercises. ? Stopping tobacco use. ? Limiting alcohol intake.  Taking medicine to slow the process of bone loss or to increase bone density.  Taking daily supplements of calcium and vitamin D.  Taking  hormone replacement medicines, such as estrogen for women and testosterone for men.  Monitoring your levels of calcium and vitamin D. Follow these instructions at home:  Activity  Exercise as told by your health care provider. Ask your health care provider what exercises and activities are safe for you. You should do: ? Exercises that make you work against gravity (weight-bearing exercises), such as tai chi, yoga, or walking. ? Exercises to strengthen muscles, such as lifting weights. Lifestyle  Limit alcohol intake to no more than 1 drink a day for nonpregnant women and 2 drinks a day for men. One drink equals 12 oz of beer, 5 oz of wine, or 1 oz of hard liquor.  Do not use any products that contain nicotine or tobacco, such as cigarettes and e-cigarettes. If you need help quitting, ask your health care provider. Preventing falls  Use devices to help you move around (mobility aids) as needed, such as canes, walkers, scooters, or crutches.  Keep rooms well-lit and clutter-free.  Remove tripping hazards from walkways, including cords and throw rugs.  Install grab bars in bathrooms and safety rails on stairs.  Use rubber mats in the bathroom and other areas that are often wet or slippery.  Wear closed-toe shoes that fit well and support your feet. Wear shoes that have rubber soles or low heels.  Review your medicines with your health care provider. Some medicines can cause dizziness or changes in blood pressure, which can increase your risk of falling. General instructions  Include calcium and vitamin D in   your diet. Calcium is important for bone health, and vitamin D helps your body to absorb calcium. Good sources of calcium and vitamin D include: ? Certain fatty fish, such as salmon and tuna. ? Products that have calcium and vitamin D added to them (fortified products), such as fortified cereals. ? Egg yolks. ? Cheese. ? Liver.  Take over-the-counter and prescription medicines  only as told by your health care provider.  Keep all follow-up visits as told by your health care provider. This is important. Contact a health care provider if:  You have never been screened for osteoporosis and you are: ? A woman who is age 65 or older. ? A man who is age 70 or older. Get help right away if:  You fall or injure yourself. Summary  Osteoporosis is thinning and loss of density in your bones. This makes bones more brittle and fragile and more likely to break (fracture),even with minor falls.  The goal of treatment is to strengthen your bones and reduce your risk for a fracture.  Include calcium and vitamin D in your diet. Calcium is important for bone health, and vitamin D helps your body to absorb calcium.  Talk with your health care provider about screening for osteoporosis if you are a woman who is age 65 or older, or a man who is age 70 or older. This information is not intended to replace advice given to you by your health care provider. Make sure you discuss any questions you have with your health care provider. Document Revised: 05/11/2017 Document Reviewed: 03/23/2017 Elsevier Patient Education  2020 Elsevier Inc.  

## 2020-02-23 ENCOUNTER — Other Ambulatory Visit: Payer: Self-pay

## 2020-02-23 ENCOUNTER — Ambulatory Visit: Payer: BC Managed Care – PPO

## 2020-02-23 ENCOUNTER — Ambulatory Visit: Payer: BC Managed Care – PPO | Attending: Family Medicine

## 2020-02-23 DIAGNOSIS — M25511 Pain in right shoulder: Secondary | ICD-10-CM | POA: Insufficient documentation

## 2020-02-23 DIAGNOSIS — M6281 Muscle weakness (generalized): Secondary | ICD-10-CM

## 2020-02-23 DIAGNOSIS — G8929 Other chronic pain: Secondary | ICD-10-CM | POA: Insufficient documentation

## 2020-02-23 DIAGNOSIS — R293 Abnormal posture: Secondary | ICD-10-CM | POA: Diagnosis present

## 2020-02-23 NOTE — Therapy (Signed)
[facility] [address] [place], [place], [address] Phone: [phone]   Fax:  [phone]  Physical Therapy Evaluation  Patient Details  Name: Natalie Carpenter MRN: [id number] Date of Birth: 1961-05-24 Referring Provider (PT): Carolann Littler, MD   Encounter Date: 02/23/2020   PT End of Session - 02/23/20 1655    Visit Number 1    Date for PT Re-Evaluation 04/05/20    Authorization Type BCBS    PT Start Time 1616    PT Stop Time 1654    PT Time Calculation (min) 38 min    Activity Tolerance Patient tolerated treatment well    Behavior During Therapy [facility] for tasks assessed/performed           History reviewed. No pertinent past medical history.  Past Surgical History:  Procedure Laterality Date  . HERNIA REPAIR  7902   umbilical hernia    There were no vitals filed for this visit.    Subjective Assessment - 02/23/20 1618    Subjective Pt presents to PT with recent diagnosis of osteoporosis.  Pt is active with walking 5-6 days a week.    Pertinent History none    Diagnostic tests bone density: t-score -2.7 spine, -2.3 femur    Patient Stated Goals build HEP for strength to help bone density.  Return to pickleball/tennis    Currently in Pain? No/denies   Rt shoulder pain with tennis             [facility] PT Assessment - 02/23/20 0001      Assessment   Medical Diagnosis osteoporosis without current pathological fracture    Referring Provider (PT) Carolann Littler, MD    Onset Date/Surgical Date 02/11/20    Next MD Visit none    Prior Therapy none      Precautions   Precautions Other (comment)   osteoporosis     Balance Screen   Has the patient fallen in the past 6 months No    Has the patient had a decrease in activity level because of a fear of falling?  No    Is the patient reluctant to leave their home because of a fear of falling?  No      Home Environment   Living Environment Private  residence    Living Arrangements Alone    Type of [place]      Prior Function   Level of [place] Part time employment    Leisure swims, walks      Cognition   Overall Cognitive Status Within Functional Limits for tasks assessed      Posture/Postural Control   Posture/Postural Control Postural limitations    Postural Limitations Rounded Shoulders;Forward head      ROM / Strength   AROM / PROM / Strength AROM;Strength      AROM   Overall AROM  Within functional limits for tasks performed    Overall AROM Comments Rt shoulder painful at end range flexion and abducton      Strength   Overall Strength Within functional limits for tasks performed    Overall Strength Comments UE/LE strength is full.  Postural strength 4/5, core strength 4/5      Palpation   Palpation comment palpable tenderness over anterior aspect of Rt shoulder at proximal biceps tendon      Transfers   Transfers Independent with all Transfers      Ambulation/Gait   Ambulation/Gait Yes  Gait Pattern Within Functional Limits                      Objective measurements completed on examination: See above findings.               PT Education - 02/23/20 1640    Education Details Access Code: [email or web address]    Person(s) Educated Patient    Methods Explanation;Demonstration;Handout    Comprehension Verbalized understanding;Returned demonstration            PT Short Term Goals - 02/23/20 1657      PT SHORT TERM GOAL #1   Title be independent in initial HEP    Time 2    Period Weeks    Status New    Target Date 03/08/20             PT Long Term Goals - 02/23/20 1658      PT LONG TERM GOAL #1   Title Patient will verbally understand correct body mechanics for home and work tasks to decrease strain on spine.    Time 6    Period Weeks    Status New    Target Date 04/05/20      PT LONG TERM GOAL #2   Title be independent in HEP focusing on  managing osteoporosis postural deficits and strength deficits.    Time 6    Period Weeks    Status New    Target Date 04/05/20      PT LONG TERM GOAL #3   Title Patient can verbally understand the dos and don'ts of osteoporosis management.    Time 6    Period Weeks    Status New    Target Date 04/05/20      PT LONG TERM GOAL #4   Title report improved Rt shoulder pain to allow for return to tennis/pickelball    Time 6    Period Weeks    Status New    Target Date 04/05/20                  Plan - 02/23/20 1703    Clinical Impression Statement Pt presents to PT with osteoporosis (t-score -2.7 spine and -2.3 femur) to learn exercises for improved bone density and receive education to prevent risk of fracture.  Pt also reports rt shoulder pain that is limiting her ability to play tennis and pickleball. Pt walks daily for exercise and has not been doing any weight training at home.   Pt demonstrates forward head and rounded shoulder posture, and rhomboid atrophy. Pt with full UE A/ROM with Rt shoulder pain at end range flexion and abduction with palpable tenderness over Rt anterior shoulder at proximal biceps tendon.  Pt will benefit from skilled PT for osteoporosis education regarding body mechanics, exercise and weightbearing activity to reduce risk of fractures and improve bone density.    Personal Factors and Comorbidities Comorbidity 1    Comorbidities osteoporosis    Examination-Activity Limitations Reach Overhead    Stability/Clinical Decision Making Stable/Uncomplicated    Clinical Decision Making Low    Rehab Potential Excellent    PT Frequency 1x / week    PT Duration 6 weeks    PT Treatment/Interventions ADLs/Self Care Home Management;Cryotherapy;Electrical Stimulation;Iontophoresis 4mg /ml Dexamethasone;Moist Heat;Neuromuscular re-education;Balance training;Therapeutic exercise;Patient/family education;Manual techniques;Passive range of motion;Taping    PT Next Visit  Plan education on body mechanics related to osteoporosis, review HEP and add weightbearing hip strength. 1-3 sessions probable.  PT Home Exercise Plan Access Code: [email or web address]    Consulted and Agree with Plan of Care Patient           Patient will benefit from skilled therapeutic intervention in order to improve the following deficits and impairments:  Decreased activity tolerance, Decreased strength, Postural dysfunction, Improper body mechanics, Pain  Visit Diagnosis: Muscle weakness (generalized) - Plan: PT plan of care cert/re-cert  Abnormal posture - Plan: PT plan of care cert/re-cert  Chronic right shoulder pain - Plan: PT plan of care cert/re-cert     Problem List Patient Active Problem List   Diagnosis Date Noted  . Osteoporosis 01/02/2020  . Trochanteric bursitis, left hip 08/01/2017  . Left hip pain 02/05/2017  . 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 SINUSITIS, ACUTE 05/10/2009  . DYSFUNCTION OF EUSTACHIAN TUBE 12/09/2008  . LATERAL EPICONDYLITIS, LEFT 06/22/2008  . WEAKNESS, MUSCLE 10/19/2006  . DRY SKIN 10/19/2006  . CONTUSION, TOE 10/17/2006  . SHOULDER PAIN, RIGHT 09/21/2006  . NECK PAIN, CHRONIC 09/21/2006     Sigurd Sos, PT 02/23/20 5:07 PM  [facility] [address], [place], [place], [address] Phone: [phone]   Fax:  [phone]  Name: Yaretsi Humphres MRN: [id number] Date of Birth: [birth year]

## 2020-02-23 NOTE — Patient Instructions (Signed)
Access Code: [email or web address] URL: [email or web address] Date: 02/23/2020 Prepared by: Claiborne Billings  Exercises Seated Shoulder Abduction - Palms Down - 2 x daily - 7 x weekly - 10 reps - 2 sets Seated Shoulder Flexion - 2 x daily - 7 x weekly - 10 reps - 2 sets Seated Shoulder Scaption - 2 x daily - 7 x weekly - 10 reps - 2 sets Seated Shoulder Horizontal Abduction with Resistance - 1 x daily - 7 x weekly - 3 sets - 10 reps Seated Shoulder W External Rotation on Swiss Ball - 1 x daily - 7 x weekly - 3 sets - 10 reps Sit to Stand without Arm Support - 2 x daily - 7 x weekly - 2 sets - 10 reps Shoulder Flexion Wall Slide with Towel - 1 x daily - 7 x weekly - 1 sets - 10 reps - 10 hold Standing Shoulder Abduction Slides at Wall - 1 x daily - 7 x weekly - 1 sets - 10 reps - 10 hold Seated Correct Posture - 1 x daily - 7 x weekly - 3 sets - 10 reps

## 2020-03-04 ENCOUNTER — Encounter: Payer: BC Managed Care – PPO | Admitting: Physical Therapy

## 2020-03-09 ENCOUNTER — Encounter: Payer: BC Managed Care – PPO | Admitting: Physical Therapy

## 2020-03-17 ENCOUNTER — Ambulatory Visit: Payer: BC Managed Care – PPO

## 2021-02-08 ENCOUNTER — Other Ambulatory Visit: Payer: Self-pay | Admitting: Obstetrics and Gynecology

## 2021-02-08 ENCOUNTER — Other Ambulatory Visit: Payer: Self-pay | Admitting: *Deleted

## 2021-02-08 DIAGNOSIS — Z1231 Encounter for screening mammogram for malignant neoplasm of breast: Secondary | ICD-10-CM

## 2021-02-11 ENCOUNTER — Ambulatory Visit: Payer: BC Managed Care – PPO

## 2021-03-21 ENCOUNTER — Ambulatory Visit: Payer: BC Managed Care – PPO

## 2021-03-22 ENCOUNTER — Ambulatory Visit: Payer: Self-pay

## 2021-03-23 ENCOUNTER — Other Ambulatory Visit: Payer: Self-pay | Admitting: Obstetrics and Gynecology

## 2021-03-23 DIAGNOSIS — N644 Mastodynia: Secondary | ICD-10-CM

## 2021-04-11 ENCOUNTER — Ambulatory Visit
Admission: RE | Admit: 2021-04-11 | Discharge: 2021-04-11 | Disposition: A | Discharge status: Home / Self Care | Payer: Self-pay | Source: Ambulatory Visit | Attending: Obstetrics and Gynecology | Admitting: Obstetrics and Gynecology

## 2021-04-11 ENCOUNTER — Ambulatory Visit: Payer: Self-pay

## 2021-04-11 ENCOUNTER — Ambulatory Visit
Admission: RE | Admit: 2021-04-11 | Discharge: 2021-04-11 | Disposition: A | Discharge status: Home / Self Care | Payer: BC Managed Care – PPO | Source: Ambulatory Visit | Attending: Obstetrics and Gynecology | Admitting: Obstetrics and Gynecology

## 2021-04-11 ENCOUNTER — Other Ambulatory Visit: Payer: Self-pay

## 2021-04-11 DIAGNOSIS — N644 Mastodynia: Secondary | ICD-10-CM

## 2021-08-08 ENCOUNTER — Encounter: Payer: Self-pay | Admitting: Family Medicine

## 2022-01-26 ENCOUNTER — Other Ambulatory Visit: Payer: Self-pay | Admitting: Obstetrics and Gynecology

## 2022-01-26 DIAGNOSIS — M810 Age-related osteoporosis without current pathological fracture: Secondary | ICD-10-CM

## 2022-03-10 ENCOUNTER — Other Ambulatory Visit: Payer: Self-pay | Admitting: Obstetrics and Gynecology

## 2022-03-10 DIAGNOSIS — Z1231 Encounter for screening mammogram for malignant neoplasm of breast: Secondary | ICD-10-CM

## 2022-04-19 ENCOUNTER — Ambulatory Visit
Admission: RE | Admit: 2022-04-19 | Discharge: 2022-04-19 | Disposition: A | Discharge status: Home / Self Care | Payer: BC Managed Care – PPO | Source: Ambulatory Visit | Attending: Obstetrics and Gynecology | Admitting: Obstetrics and Gynecology

## 2022-04-19 DIAGNOSIS — Z1231 Encounter for screening mammogram for malignant neoplasm of breast: Secondary | ICD-10-CM

## 2022-05-31 ENCOUNTER — Ambulatory Visit (INDEPENDENT_AMBULATORY_CARE_PROVIDER_SITE_OTHER): Payer: BC Managed Care – PPO | Admitting: Family Medicine

## 2022-05-31 ENCOUNTER — Encounter: Payer: Self-pay | Admitting: Family Medicine

## 2022-05-31 VITALS — BP 110/80 | HR 70 | Temp 97.5°F | Ht 66.54 in | Wt 132.2 lb

## 2022-05-31 DIAGNOSIS — Z0000 Encounter for general adult medical examination without abnormal findings: Secondary | ICD-10-CM | POA: Diagnosis not present

## 2022-05-31 LAB — CBC WITH DIFFERENTIAL/PLATELET
Basophils Absolute: 0 10*3/uL (ref 0.0–0.1)
Basophils Relative: 0.5 % (ref 0.0–3.0)
Eosinophils Absolute: 0.2 10*3/uL (ref 0.0–0.7)
Eosinophils Relative: 4.4 % (ref 0.0–5.0)
HCT: 38.9 % (ref 36.0–46.0)
Hemoglobin: 12.8 g/dL (ref 12.0–15.0)
Lymphocytes Relative: 27.4 % (ref 12.0–46.0)
Lymphs Abs: 1.5 10*3/uL (ref 0.7–4.0)
MCHC: 32.9 g/dL (ref 30.0–36.0)
MCV: 90 fl (ref 78.0–100.0)
Monocytes Absolute: 0.5 10*3/uL (ref 0.1–1.0)
Monocytes Relative: 9.3 % (ref 3.0–12.0)
Neutro Abs: 3.2 10*3/uL (ref 1.4–7.7)
Neutrophils Relative %: 58.4 % (ref 43.0–77.0)
Platelets: 258 10*3/uL (ref 150.0–400.0)
RBC: 4.33 Mil/uL (ref 3.87–5.11)
RDW: 13.5 % (ref 11.5–15.5)
WBC: 5.5 10*3/uL (ref 4.0–10.5)

## 2022-05-31 LAB — BASIC METABOLIC PANEL
BUN: 12 mg/dL (ref 6–23)
CO2: 28 mEq/L (ref 19–32)
Calcium: 10 mg/dL (ref 8.4–10.5)
Chloride: 102 mEq/L (ref 96–112)
Creatinine, Ser: 0.82 mg/dL (ref 0.40–1.20)
GFR: 76.49 mL/min (ref >60.00)
Glucose, Bld: 83 mg/dL (ref 70–99)
Potassium: 4.3 mEq/L (ref 3.5–5.1)
Sodium: 136 mEq/L (ref 135–145)

## 2022-05-31 LAB — HEPATIC FUNCTION PANEL
ALT: 14 U/L (ref 0–35)
AST: 21 U/L (ref 0–37)
Albumin: 4.6 g/dL (ref 3.5–5.2)
Alkaline Phosphatase: 67 U/L (ref 39–117)
Bilirubin, Direct: 0.1 mg/dL (ref 0.0–0.3)
Total Bilirubin: 0.6 mg/dL (ref 0.2–1.2)
Total Protein: 7.3 g/dL (ref 6.0–8.3)

## 2022-05-31 LAB — LIPID PANEL
Cholesterol: 244 mg/dL — ABNORMAL HIGH (ref 0–200)
HDL: 74 mg/dL (ref >39.00)
LDL Cholesterol: 161 mg/dL — ABNORMAL HIGH (ref 0–99)
NonHDL: 170.18
Total CHOL/HDL Ratio: 3
Triglycerides: 48 mg/dL (ref 0.0–149.0)
VLDL: 9.6 mg/dL (ref 0.0–40.0)

## 2022-05-31 LAB — TSH: TSH: 1.84 u[IU]/mL (ref 0.35–5.50)

## 2022-05-31 NOTE — Patient Instructions (Signed)
Consider Shingrix vaccine at some point this year.  

## 2022-05-31 NOTE — Progress Notes (Signed)
Established Patient Office Visit  Subjective   Patient ID: Natalie Carpenter, female    DOB: 04/02/1961  Age: 61 y.o. MRN: [id number]  No chief complaint on file.   HPI   Bird is seen for physical exam.  She sees gynecology regularly and mammogram and Pap smear up-to-date.  Generally very healthy.  She does have history of osteoporosis but is decided against medical therapy.  She exercise regular with pickleball 2 days/week and also walks regularly.  She takes no regular medications.  Health maintenance reviewed  -No history of shingles vaccine -Has not gotten RSV vaccine. -Other vaccines up-to-date -She states her Pap smear is up-to-date but we do not have records of that -Colonoscopy is due 5/27  Social history-she is married.  First grandchild 6 months ago.  She has a Conservator, museum/gallery.  Patient has never smoked.  Infrequent alcohol use.  Her husband is a retired professor  Family history-mother was diagnosed with breast cancer at age 61 last year.  She had mastectomy.  Doing well overall.  Her father had prostate cancer and history of stroke in his 60s  History reviewed. No pertinent past medical history. Past Surgical History:  Procedure Laterality Date   HERNIA REPAIR  1027   umbilical hernia    reports that she has never smoked. She has never used smokeless tobacco. She reports current alcohol use. She reports that she does not use drugs. family history includes Cancer in her father; Cancer (age of onset: 25) in her mother; Sarcoidosis in her sister. Allergies  Allergen Reactions   Sulfonamide Derivatives     REACTION: rash    Review of Systems  Constitutional:  Negative for chills, fever, malaise/fatigue and weight loss.  HENT:  Negative for hearing loss.   Eyes:  Negative for blurred vision and double vision.  Respiratory:  Negative for cough and shortness of breath.   Cardiovascular:  Negative for chest pain, palpitations and leg swelling.  Gastrointestinal:  Negative  for abdominal pain, blood in stool, constipation and diarrhea.  Genitourinary:  Negative for dysuria.  Skin:  Negative for rash.  Neurological:  Negative for dizziness, speech change, seizures, loss of consciousness and headaches.  Psychiatric/Behavioral:  Negative for depression.       Objective:     BP 110/80 (BP Location: Left Arm, Patient Position: Sitting, Cuff Size: Normal)   Pulse 70   Temp (!) 97.5 F (36.4 C) (Oral)   Ht 5' 6.54" (1.69 m)   Wt 132 lb 3.2 oz (60 kg)   LMP 09/03/2010   SpO2 98%   BMI 21.00 kg/m  BP Readings from Last 3 Encounters:  05/31/22 110/80  01/02/20 120/70  09/05/19 110/62   Wt Readings from Last 3 Encounters:  05/31/22 132 lb 3.2 oz (60 kg)  01/02/20 131 lb 1 oz (59.4 kg)  09/05/19 128 lb 14.4 oz (58.5 kg)      Physical Exam Vitals reviewed.  Constitutional:      General: She is not in acute distress.    Appearance: She is well-developed. She is not ill-appearing.  HENT:     Head: Normocephalic and atraumatic.  Eyes:     Pupils: Pupils are equal, round, and reactive to light.  Neck:     Thyroid: No thyromegaly.  Cardiovascular:     Rate and Rhythm: Normal rate and regular rhythm.     Heart sounds: Normal heart sounds. No murmur heard. Pulmonary:     Effort: No respiratory distress.  Breath sounds: Normal breath sounds. No wheezing or rales.  Abdominal:     General: Bowel sounds are normal. There is no distension.     Palpations: Abdomen is soft. There is no mass.     Tenderness: There is no abdominal tenderness. There is no guarding or rebound.  Musculoskeletal:        General: Normal range of motion.     Cervical back: Normal range of motion and neck supple.     Right lower leg: No edema.     Left lower leg: No edema.  Lymphadenopathy:     Cervical: No cervical adenopathy.  Skin:    Findings: No rash.  Neurological:     Mental Status: She is alert and oriented to person, place, and time.     Cranial Nerves: No  cranial nerve deficit.  Psychiatric:        Behavior: Behavior normal.        Thought Content: Thought content normal.        Judgment: Judgment normal.      No results found for any visits on 05/31/22.    The 10-year ASCVD risk score (Arnett DK, et al., 2019) is: 2.9%    Assessment & Plan:   Problem List Items Addressed This Visit   None Visit Diagnoses     Physical exam    -  Primary   Relevant Orders   Basic metabolic panel   Lipid panel   CBC with Differential/Platelet   TSH   Hepatic function panel     -Flu vaccine already given -We did suggest Shingrix and she declines today but will consider at some point this year -She has repeat DEXA scan scheduled per GYN -Continue regular weightbearing exercise -Continue low saturated fat diet -We did discuss consideration for RSV vaccine, although she is low risk  No follow-ups on file.    Carolann Littler, MD

## 2022-07-10 ENCOUNTER — Telehealth: Payer: Self-pay | Admitting: Family Medicine

## 2022-07-10 NOTE — Telephone Encounter (Signed)
Pt called in and stated that INS did not cover her physical because the preventive code is not matching with the procedure lab code [phone].  [facility] stated that we needed to change the code so it can match. Once code has been changed please rebill and give pt a call.   Please advise.

## 2022-07-12 ENCOUNTER — Ambulatory Visit
Admission: RE | Admit: 2022-07-12 | Discharge: 2022-07-12 | Disposition: A | Discharge status: Home / Self Care | Payer: BC Managed Care – PPO | Source: Ambulatory Visit | Attending: Obstetrics and Gynecology | Admitting: Obstetrics and Gynecology

## 2022-07-12 DIAGNOSIS — M810 Age-related osteoporosis without current pathological fracture: Secondary | ICD-10-CM

## 2022-08-10 NOTE — Telephone Encounter (Signed)
Pt called to FU on the status of her inquiry.   Pt states she has been waiting a long time for a response and she is being billed.

## 2022-08-18 NOTE — Telephone Encounter (Signed)
Hi Natalie Carpenter can anything be done with the below?

## 2022-08-21 NOTE — Telephone Encounter (Signed)
I spoke with the patient and she reported that she does not have a Thyroid issue and did not request blood draw for TSH. Patient stated that she would like a call back to discuss as she is not willing to pay for this test.

## 2022-08-22 NOTE — Telephone Encounter (Signed)
I spoke with the patient and attempted to provide her the number for [facility] but patient stated she will not call because the issue is with our office and changing the procedure code for TSH test. Patient stated that we are sending her in [place] and she will file a complaint in order for her not to be charged for lab test.

## 2023-01-05 ENCOUNTER — Telehealth: Payer: Self-pay | Admitting: Family Medicine

## 2023-01-05 ENCOUNTER — Encounter: Payer: Self-pay | Admitting: Family Medicine

## 2023-01-05 ENCOUNTER — Telehealth: Payer: BC Managed Care – PPO | Admitting: Family Medicine

## 2023-01-05 VITALS — Ht 66.54 in | Wt 132.2 lb

## 2023-01-05 DIAGNOSIS — J019 Acute sinusitis, unspecified: Secondary | ICD-10-CM

## 2023-01-05 MED ORDER — AZITHROMYCIN 250 MG PO TABS: ORAL_TABLET | ORAL | 0 refills | Status: AC

## 2023-01-05 NOTE — Progress Notes (Signed)
Patient ID: Natalie Carpenter, female   DOB: Oct 13, 1960, 62 y.o.   MRN: [id number]   Virtual Visit via Video Note  I connected with Natalie Carpenter on 01/05/23 at  3:00 PM EDT by a video enabled telemedicine application and verified that I am speaking with the correct person using two identifiers.  Location patient: home Location provider:work or home office Persons participating in the virtual visit: patient, provider  I discussed the limitations of evaluation and management by telemedicine and the availability of in person appointments. The patient expressed understanding and agreed to proceed.   HPI: [name] relates onset a few days ago of sore throat followed by nasal congestion with thick yellowish mucus.  She has had some sinus pressure.  No cough.  No fever.  No fatigue issues.  One of her main concerns is that she is traveling to [place] next week to see her mother.  Denies any sick contacts.  She has low clinical suspicion for COVID based on prior symptoms.  Her main concern was progressive sinusitis.  She has not had sinusitis in a few years.   ROS: See pertinent positives and negatives per HPI.  History reviewed. No pertinent past medical history.  Past Surgical History:  Procedure Laterality Date   HERNIA REPAIR  1994   umbilical hernia    Family History  Problem Relation Age of Onset   Cancer Mother 24       breast cancer   Cancer Father        prostate   Sarcoidosis Sister     SOCIAL HX: Non-smoker   Current Outpatient Medications:    azithromycin (ZITHROMAX Z-PAK) 250 MG tablet, Take 2 tablets (500 mg) on  Day 1,  followed by 1 tablet (250 mg) once daily on Days 2 through 5., Disp: 6 each, Rfl: 0   Loratadine 10 MG CAPS, , Disp: , Rfl:    magnesium 30 MG tablet, Take 30 mg by mouth 2 (two) times daily., Disp: , Rfl:    Multiple Vitamins-Minerals (WOMENS MULTI VITAMIN & MINERAL PO), Take by mouth daily.  , Disp: , Rfl:    Probiotic Product (PRO-BIOTIC BLEND PO), Take by  mouth as needed., Disp: , Rfl:   EXAM:  VITALS per patient if applicable:  GENERAL: alert, oriented, appears well and in no acute distress  HEENT: atraumatic, conjunttiva clear, no obvious abnormalities on inspection of external nose and ears  NECK: normal movements of the head and neck  LUNGS: on inspection no signs of respiratory distress, breathing rate appears normal, no obvious gross SOB, gasping or wheezing  CV: no obvious cyanosis  MS: moves all visible extremities without noticeable abnormality  PSYCH/NEURO: pleasant and cooperative, no obvious depression or anxiety, speech and thought processing grossly intact  ASSESSMENT AND PLAN:  Discussed the following assessment and plan:  Acute sinusitis symptoms.  We discussed the fact that most sinusitis is viral.  She will hold antibiotics at this point but if she has progressive facial pain, teeth pain, etc. consider starting Zithromax for 5 days.  Otherwise stay hydrated and consider over-the-counter plain Mucinex     I discussed the assessment and treatment plan with the patient. The patient was provided an opportunity to ask questions and all were answered. The patient agreed with the plan and demonstrated an understanding of the instructions.   The patient was advised to call back or seek an in-person evaluation if the symptoms worsen or if the condition fails to improve as anticipated.  Evelena Peat, MD

## 2023-01-05 NOTE — Telephone Encounter (Signed)
Pt is out of town, will be back tomorrow. requesting a z pak, experiencing green sinus discharge, swollen lymph nodes swollen on right side, sore throat x 3d. Pt requesting a call to confirm.  [id number] [place], [place] - [address] Phone: [phone]  Fax: [phone]

## 2023-01-08 ENCOUNTER — Encounter: Payer: Self-pay | Admitting: Family Medicine

## 2023-02-21 ENCOUNTER — Other Ambulatory Visit: Payer: Self-pay | Admitting: Obstetrics and Gynecology

## 2023-02-21 DIAGNOSIS — Z1231 Encounter for screening mammogram for malignant neoplasm of breast: Secondary | ICD-10-CM

## 2023-05-14 ENCOUNTER — Ambulatory Visit
Admission: RE | Admit: 2023-05-14 | Discharge: 2023-05-14 | Disposition: A | Discharge status: Home / Self Care | Payer: BC Managed Care – PPO | Source: Ambulatory Visit | Attending: Obstetrics and Gynecology | Admitting: Obstetrics and Gynecology

## 2023-05-14 DIAGNOSIS — Z1231 Encounter for screening mammogram for malignant neoplasm of breast: Secondary | ICD-10-CM

## 2023-07-11 ENCOUNTER — Ambulatory Visit: Payer: 59 | Admitting: Family Medicine

## 2023-07-11 VITALS — BP 116/84 | HR 62 | Temp 97.5°F | Wt 124.9 lb

## 2023-07-11 DIAGNOSIS — J019 Acute sinusitis, unspecified: Secondary | ICD-10-CM

## 2023-07-11 MED ORDER — AZITHROMYCIN 250 MG PO TABS: ORAL_TABLET | ORAL | 0 refills | Status: AC

## 2023-07-11 NOTE — Progress Notes (Signed)
   Established Patient Office Visit  Subjective   Patient ID: Natalie Carpenter, female    DOB: Mar 04, 1961  Age: 63 y.o. MRN: [id number]  Chief Complaint  Patient presents with   Cough    Patient complains of productive cough, x8 days, Tried Mucinex    Sinusitis    Patient complains of sinusitis, x8 days,     HPI   Natalie Carpenter is seen with upper respiratory symptoms.  She started with what sounds like more or less cold about 8 days ago.  Never had any sore throat.  She had some cough predominantly but is now developing more sinus symptoms.  She has had some recent headache and some colored nasal discharge.  Increasing fatigue.  No fever.  She is taken over-the-counter Mucinex without much improvement.  History reviewed. No pertinent past medical history. Past Surgical History:  Procedure Laterality Date   BREAST BIOPSY Left    benign per pt   HERNIA REPAIR  06/12/1992   umbilical hernia    reports that she has never smoked. She has never used smokeless tobacco. She reports current alcohol use. She reports that she does not use drugs. family history includes Breast cancer (age of onset: 31) in her mother; Prostate cancer in her father; Sarcoidosis in her sister. Allergies  Allergen Reactions   Sulfonamide Derivatives     REACTION: rash    Review of Systems  Constitutional:  Negative for chills and fever.  HENT:  Positive for sinus pain. Negative for sore throat.   Respiratory:  Positive for cough. Negative for hemoptysis, shortness of breath and wheezing.   Cardiovascular:  Negative for chest pain.      Objective:     BP 116/84 (BP Location: Left Arm, Patient Position: Sitting, Cuff Size: Normal)   Pulse 62   Temp (!) 97.5 F (36.4 C) (Oral)   Wt 124 lb 14.4 oz (56.7 kg)   LMP 09/03/2010   SpO2 98%   BMI 19.83 kg/m  BP Readings from Last 3 Encounters:  07/11/23 116/84  05/31/22 110/80  01/02/20 120/70   Wt Readings from Last 3 Encounters:  07/11/23 124 lb 14.4 oz (56.7  kg)  01/05/23 132 lb 3.2 oz (60 kg)  05/31/22 132 lb 3.2 oz (60 kg)      Physical Exam Vitals reviewed.  Constitutional:      General: She is not in acute distress.    Appearance: She is not ill-appearing.  HENT:     Right Ear: Tympanic membrane normal.     Left Ear: Tympanic membrane normal.  Cardiovascular:     Rate and Rhythm: Normal rate and regular rhythm.  Pulmonary:     Effort: Pulmonary effort is normal.     Breath sounds: Normal breath sounds. No wheezing or rales.  Neurological:     Mental Status: She is alert.      No results found for any visits on 07/11/23.    The 10-year ASCVD risk score (Arnett DK, et al., 2019) is: 3.6%    Assessment & Plan:   Acute respiratory symptoms.  She has had cough now for about 8 days but is now having progressive sinusitis symptoms with facial pain and some headaches and purulent nasal discharge.  We discussed going ahead and starting Zithromax for 5 days.  Continue Mucinex.  Stay well-hydrated.  Follow-up for any persistent or worsening symptoms.  Evelena Peat, MD

## 2024-04-14 ENCOUNTER — Other Ambulatory Visit: Payer: Self-pay | Admitting: Family Medicine

## 2024-04-14 DIAGNOSIS — Z1231 Encounter for screening mammogram for malignant neoplasm of breast: Secondary | ICD-10-CM

## 2024-05-14 ENCOUNTER — Ambulatory Visit: Admission: RE | Admit: 2024-05-14 | Discharge: 2024-05-14 | Disposition: A | Source: Ambulatory Visit

## 2024-05-14 DIAGNOSIS — Z1231 Encounter for screening mammogram for malignant neoplasm of breast: Secondary | ICD-10-CM

## 2024-07-14 ENCOUNTER — Ambulatory Visit: Payer: Self-pay | Admitting: Family Medicine

## 2024-07-14 NOTE — Telephone Encounter (Signed)
" °  FYI Only or Action Required?: Action required by provider: Medication Request.  Patient was last seen in primary care on 07/11/2023 by Micheal Wolm ORN, MD.  Called Nurse Triage reporting Sinusitis.  Symptoms began a week ago.  Interventions attempted: OTC medications: Sinex.  Symptoms are: gradually worsening.  Triage Disposition: See PCP When Office is Open (Within 3 Days)  Patient/caregiver understands and will follow disposition?: Yes  Message from Amber H sent at 07/14/2024 10:07 AM EST  Reason for Triage: Patient c/o head pressure in sinuses, fatigue, coughing up thick yellow/green mucous, congestion, no nasal drainage. Denied SOB/CP. Symps started 10 days ago.  Britian[place]- [phone]   Reason for Disposition  [1] Sinus congestion (pressure, fullness) AND [2] present > 10 days  Answer Assessment - Initial Assessment Questions 1. LOCATION: Where does it hurt?      Forehead, nose, and cheeks  2. ONSET: When did the sinus pain start?  (e.g., hours, days)      X 10 days  3. SEVERITY: How bad is the pain?   (Scale 0-10; or none, mild, moderate or severe)     Worse since onset  4. RECURRENT SYMPTOM: Have you ever had sinus problems before? If Yes, ask: When was the last time? and What happened that time?       Denies  5. NASAL CONGESTION: Is the nose blocked? If Yes, ask: Can you open it or must you breathe through your mouth?     Yes.  6. NASAL DISCHARGE: Do you have discharge from your nose? If so ask, What color?     No  7. FEVER: Do you have a fever? If Yes, ask: What is it, how was it measured, and when did it start?       Denies  8. OTHER SYMPTOMS: Do you have any other symptoms? (e.g., sore throat, cough, earache, difficulty breathing)     Fatigue, yellow/green mucus, back pain  Pt reports sinus pain Pt is taking OTC Sinex Pt scheduled for a  video visit on 2.2.26  for further evaluation. Pt advised to try OTC mucinex. Pt requesting  urgent message to be sent to PCP. Routing to clinic per pt's request. Pt agrees with plan of care, will call back for any worsening symptoms  Protocols used: Sinus Pain or Congestion-A-AH  "

## 2024-07-15 ENCOUNTER — Ambulatory Visit: Admitting: Family Medicine

## 2024-07-16 ENCOUNTER — Encounter: Payer: Self-pay | Admitting: Family Medicine

## 2024-07-16 ENCOUNTER — Telehealth: Admitting: Family Medicine

## 2024-07-16 ENCOUNTER — Ambulatory Visit: Admitting: Family Medicine

## 2024-07-16 DIAGNOSIS — J0100 Acute maxillary sinusitis, unspecified: Secondary | ICD-10-CM

## 2024-07-16 DIAGNOSIS — Z0184 Encounter for antibody response examination: Secondary | ICD-10-CM

## 2024-07-16 MED ORDER — CYCLOBENZAPRINE HCL 5 MG PO TABS: 5.0000 mg | ORAL_TABLET | Freq: Three times a day (TID) | ORAL | 1 refills | Status: AC | PRN

## 2024-07-16 MED ORDER — CEFDINIR 300 MG PO CAPS: 300.0000 mg | ORAL_CAPSULE | Freq: Two times a day (BID) | ORAL | 0 refills | Status: AC

## 2024-07-16 NOTE — Progress Notes (Signed)
 Patient ID: Natalie Carpenter, female   DOB: 05-Aug-1960, 64 y.o.   MRN: [id number]   Virtual Visit via Video Note  I connected with Natalie Carpenter on 07/16/24 at  4:45 PM EST by a video enabled telemedicine application and verified that I am speaking with the correct person using two identifiers.  Location patient: home Location provider:work or home office Persons participating in the virtual visit: patient, provider  I discussed the limitations of evaluation and management by telemedicine and the availability of in person appointments. The patient expressed understanding and agreed to proceed.   HPI:  Natalie Carpenter called with some persistent sinusitis type symptoms which have been going on really for almost 2 weeks now.  She had onset of symptoms around 23 January.  Had some initial nasal congestion.  No fever.  Has had some persistent malaise, mild cough, bilateral maxillary facial pain, daily headaches for almost 2 weeks now.  She has had some colored nasal discharge and productive cough.  Has taken several over-the-counter things including Mucinex and over-the-counter nose spray.  Also tried some Sudafed but had excessive throat drying.  No history of chronic or recurrent sinusitis.  She also has had some muscle spasm issues in the past and requesting refill of cyclobenzaprine  5 mg which she has used infrequently.  She also has questions regarding immunity status for MMR.  She would like to consider antibody testing to confirm her status.    ROS: See pertinent positives and negatives per HPI.  History reviewed. No pertinent past medical history.  Past Surgical History:  Procedure Laterality Date   BREAST BIOPSY Left    benign per pt   HERNIA REPAIR  06/12/1992   umbilical hernia    Family History  Problem Relation Age of Onset   Breast cancer Mother 21   Prostate cancer Father    Sarcoidosis Sister     SOCIAL HX: Non-smoker  Current Medications[1]  EXAM:  VITALS per patient if  applicable:  GENERAL: alert, oriented, appears well and in no acute distress  HEENT: atraumatic, conjunttiva clear, no obvious abnormalities on inspection of external nose and ears  NECK: normal movements of the head and neck  LUNGS: on inspection no signs of respiratory distress, breathing rate appears normal, no obvious gross SOB, gasping or wheezing  CV: no obvious cyanosis  MS: moves all visible extremities without noticeable abnormality  PSYCH/NEURO: pleasant and cooperative, no obvious depression or anxiety, speech and thought processing grossly intact  ASSESSMENT AND PLAN:  Discussed the following assessment and plan:  #1 acute sinusitis.  She has had persistent symptoms of malaise, bilateral facial pain, headache and ongoing nasal congestion for almost 2 weeks now.  Maybe only slightly improved compared with a week ago.  Still has considerable malaise.  Given duration of symptoms we recommended going ahead and covering with Omnicef  300 mg twice daily for 10 days.  She has had intolerance with sulfa previously.  She had some stomach cramping with Augmentin.  Follow-up for any persistent or worsening symptoms  #2 immunity status testing.  She has specific questions regarding whether she needs MMR booster vaccine.  We wrote order for future lab for MMR immunity status testing which she will schedule for March     I discussed the assessment and treatment plan with the patient. The patient was provided an opportunity to ask questions and all were answered. The patient agreed with the plan and demonstrated an understanding of the instructions.   The patient was advised to  call back or seek an in-person evaluation if the symptoms worsen or if the condition fails to improve as anticipated.     Wolm Scarlet, MD      [1]  Current Outpatient Medications:    cefdinir  (OMNICEF ) 300 MG capsule, Take 1 capsule (300 mg total) by mouth 2 (two) times daily., Disp: 20 capsule, Rfl: 0    cyclobenzaprine  (FLEXERIL ) 5 MG tablet, Take 1 tablet (5 mg total) by mouth 3 (three) times daily as needed for muscle spasms., Disp: 30 tablet, Rfl: 1   Loratadine 10 MG CAPS, , Disp: , Rfl:    magnesium 30 MG tablet, Take 30 mg by mouth 2 (two) times daily., Disp: , Rfl:    Multiple Vitamins-Minerals (WOMENS MULTI VITAMIN & MINERAL PO), Take by mouth daily.  , Disp: , Rfl:    Probiotic Product (PRO-BIOTIC BLEND PO), Take by mouth as needed., Disp: , Rfl:
